# Patient Record
Sex: Male | Born: 1970 | Race: Black or African American | Hispanic: No | Marital: Married | Smoking: Never smoker
Health system: Southern US, Community
[De-identification: ages and names within clinical notes are randomized; demographics above are authoritative.]

## PROBLEM LIST (undated history)

## (undated) DIAGNOSIS — N289 Disorder of kidney and ureter, unspecified: Secondary | ICD-10-CM

## (undated) HISTORY — PX: LITHOTRIPSY: SUR834

## (undated) HISTORY — PX: VASECTOMY: SHX75

---

## 1999-05-22 ENCOUNTER — Emergency Department (HOSPITAL_COMMUNITY): Admission: EM | Admit: 1999-05-22 | Discharge: 1999-05-22 | Payer: Self-pay | Admitting: Emergency Medicine

## 2000-01-09 ENCOUNTER — Emergency Department (HOSPITAL_COMMUNITY): Admission: EM | Admit: 2000-01-09 | Discharge: 2000-01-10 | Payer: Self-pay | Admitting: Emergency Medicine

## 2002-06-03 ENCOUNTER — Encounter: Payer: Self-pay | Admitting: Emergency Medicine

## 2002-06-03 ENCOUNTER — Emergency Department (HOSPITAL_COMMUNITY): Admission: EM | Admit: 2002-06-03 | Discharge: 2002-06-03 | Payer: Self-pay | Admitting: Emergency Medicine

## 2003-11-17 ENCOUNTER — Emergency Department (HOSPITAL_COMMUNITY): Admission: EM | Admit: 2003-11-17 | Discharge: 2003-11-17 | Payer: Self-pay | Admitting: Emergency Medicine

## 2005-03-04 ENCOUNTER — Emergency Department (HOSPITAL_COMMUNITY): Admission: EM | Admit: 2005-03-04 | Discharge: 2005-03-04 | Payer: Self-pay | Admitting: Emergency Medicine

## 2006-08-14 ENCOUNTER — Emergency Department (HOSPITAL_COMMUNITY): Admission: EM | Admit: 2006-08-14 | Discharge: 2006-08-14 | Payer: Self-pay | Admitting: Emergency Medicine

## 2008-04-03 ENCOUNTER — Emergency Department (HOSPITAL_COMMUNITY): Admission: EM | Admit: 2008-04-03 | Discharge: 2008-04-03 | Payer: Self-pay | Admitting: Emergency Medicine

## 2008-04-12 ENCOUNTER — Ambulatory Visit (HOSPITAL_COMMUNITY): Admission: RE | Admit: 2008-04-12 | Discharge: 2008-04-12 | Payer: Self-pay | Admitting: Urology

## 2010-07-01 ENCOUNTER — Emergency Department (HOSPITAL_COMMUNITY): Admission: EM | Admit: 2010-07-01 | Discharge: 2010-07-01 | Payer: Self-pay | Admitting: Emergency Medicine

## 2010-11-22 LAB — BASIC METABOLIC PANEL
BUN: 9 mg/dL (ref 6–23)
CO2: 25 mEq/L (ref 19–32)
Calcium: 9.1 mg/dL (ref 8.4–10.5)
Chloride: 108 mEq/L (ref 96–112)
Creatinine, Ser: 1.11 mg/dL (ref 0.4–1.5)
GFR calc Af Amer: >60 mL/min (ref >60)
GFR calc non Af Amer: >60 mL/min (ref >60)
Glucose, Bld: 105 mg/dL — ABNORMAL HIGH (ref 70–99)
Potassium: 3.7 mEq/L (ref 3.5–5.1)
Sodium: 140 mEq/L (ref 135–145)

## 2010-11-22 LAB — DIFFERENTIAL
Basophils Absolute: 0.1 K/uL (ref 0.0–0.1)
Basophils Relative: 1 % (ref 0–1)
Eosinophils Absolute: 0.2 K/uL (ref 0.0–0.7)
Eosinophils Relative: 3 % (ref 0–5)
Lymphocytes Relative: 25 % (ref 12–46)
Lymphs Abs: 1.9 K/uL (ref 0.7–4.0)
Monocytes Absolute: 0.7 K/uL (ref 0.1–1.0)
Monocytes Relative: 9 % (ref 3–12)
Neutro Abs: 4.9 K/uL (ref 1.7–7.7)
Neutrophils Relative %: 63 % (ref 43–77)

## 2010-11-22 LAB — CBC
HCT: 39.7 % (ref 39.0–52.0)
Hemoglobin: 13.5 g/dL (ref 13.0–17.0)
MCH: 29.3 pg (ref 26.0–34.0)
MCHC: 34 g/dL (ref 30.0–36.0)
MCV: 86.2 fL (ref 78.0–100.0)
Platelets: 241 K/uL (ref 150–400)
RBC: 4.61 MIL/uL (ref 4.22–5.81)
RDW: 13.1 % (ref 11.5–15.5)
WBC: 7.8 K/uL (ref 4.0–10.5)

## 2011-06-08 LAB — CBC
HCT: 38.6 — ABNORMAL LOW
Hemoglobin: 12.9 — ABNORMAL LOW
MCHC: 33.5
MCV: 86.8
Platelets: 202
RBC: 4.44
RDW: 13.3
WBC: 7.7

## 2011-06-08 LAB — DIFFERENTIAL
Basophils Absolute: 0
Basophils Relative: 0
Eosinophils Absolute: 0.2
Eosinophils Relative: 3
Lymphocytes Relative: 20
Lymphs Abs: 1.5
Monocytes Absolute: 0.7
Monocytes Relative: 9
Neutro Abs: 5.2
Neutrophils Relative %: 68

## 2011-06-08 LAB — BASIC METABOLIC PANEL
BUN: 16
CO2: 25
Calcium: 8.9
Chloride: 107
Creatinine, Ser: 1.06
GFR calc Af Amer: >60
GFR calc non Af Amer: >60
Glucose, Bld: 103 — ABNORMAL HIGH
Potassium: 3.7
Sodium: 138

## 2011-06-08 LAB — URINE MICROSCOPIC-ADD ON

## 2011-06-08 LAB — URINALYSIS, ROUTINE W REFLEX MICROSCOPIC
Bilirubin Urine: NEGATIVE
Glucose, UA: NEGATIVE
Ketones, ur: NEGATIVE
Leukocytes, UA: NEGATIVE
Nitrite: NEGATIVE
Protein, ur: NEGATIVE
Specific Gravity, Urine: 1.021
Urobilinogen, UA: 0.2
pH: 6

## 2011-06-08 LAB — HEPATIC FUNCTION PANEL
ALT: 21
AST: 22
Albumin: 3.8
Alkaline Phosphatase: 64
Bilirubin, Direct: 0.1
Indirect Bilirubin: 1.1 — ABNORMAL HIGH
Total Bilirubin: 1.2
Total Protein: 6.6

## 2011-06-08 LAB — LIPASE, BLOOD: Lipase: 19

## 2012-01-28 ENCOUNTER — Emergency Department (HOSPITAL_COMMUNITY)
Admission: EM | Admit: 2012-01-28 | Discharge: 2012-01-28 | Disposition: A | Discharge status: Home / Self Care | Payer: Self-pay | Attending: Emergency Medicine | Admitting: Emergency Medicine

## 2012-01-28 ENCOUNTER — Emergency Department (HOSPITAL_COMMUNITY): Payer: PRIVATE HEALTH INSURANCE

## 2012-01-28 ENCOUNTER — Emergency Department (HOSPITAL_COMMUNITY)
Admission: EM | Admit: 2012-01-28 | Discharge: 2012-01-28 | Disposition: A | Discharge status: Home / Self Care | Payer: PRIVATE HEALTH INSURANCE | Attending: Emergency Medicine | Admitting: Emergency Medicine

## 2012-01-28 ENCOUNTER — Encounter (HOSPITAL_COMMUNITY): Payer: Self-pay | Admitting: Emergency Medicine

## 2012-01-28 ENCOUNTER — Encounter (HOSPITAL_COMMUNITY): Payer: Self-pay

## 2012-01-28 DIAGNOSIS — R209 Unspecified disturbances of skin sensation: Secondary | ICD-10-CM | POA: Insufficient documentation

## 2012-01-28 DIAGNOSIS — G510 Bell's palsy: Secondary | ICD-10-CM | POA: Insufficient documentation

## 2012-01-28 DIAGNOSIS — F411 Generalized anxiety disorder: Secondary | ICD-10-CM | POA: Insufficient documentation

## 2012-01-28 DIAGNOSIS — R5383 Other fatigue: Secondary | ICD-10-CM | POA: Insufficient documentation

## 2012-01-28 DIAGNOSIS — R51 Headache: Secondary | ICD-10-CM | POA: Insufficient documentation

## 2012-01-28 DIAGNOSIS — R5381 Other malaise: Secondary | ICD-10-CM | POA: Insufficient documentation

## 2012-01-28 HISTORY — DX: Disorder of kidney and ureter, unspecified: N28.9

## 2012-01-28 MED ORDER — PREDNISONE 20 MG PO TABS: 40.0000 mg | ORAL_TABLET | Freq: Every day | ORAL | Status: AC

## 2012-01-28 MED ORDER — DIPHENHYDRAMINE HCL 50 MG/ML IJ SOLN
25.0000 mg | Freq: Once | INTRAMUSCULAR | Status: AC
  Administered 2012-01-28: 25 mg via INTRAMUSCULAR
  Filled 2012-01-28: qty 1

## 2012-01-28 MED ORDER — PREDNISONE 20 MG PO TABS
60.0000 mg | ORAL_TABLET | Freq: Once | ORAL | Status: AC
  Administered 2012-01-28: 60 mg via ORAL
  Filled 2012-01-28: qty 3

## 2012-01-28 MED ORDER — METOCLOPRAMIDE HCL 5 MG/ML IJ SOLN
10.0000 mg | Freq: Once | INTRAMUSCULAR | Status: AC
  Administered 2012-01-28: 10 mg via INTRAMUSCULAR
  Filled 2012-01-28: qty 2

## 2012-01-28 MED ORDER — ACYCLOVIR 400 MG PO TABS: 400.0000 mg | ORAL_TABLET | Freq: Every day | ORAL | Status: AC

## 2012-01-28 NOTE — ED Notes (Signed)
Pt states he began having a headache a week ago.  He also a few days ago that his food didn't taste the same.  He went to the beach over the weekend and had problems blinking his right eye closed and has numbness around the right side of his mouth.

## 2012-01-28 NOTE — ED Notes (Signed)
Dx with bell's palsy today at [facility] ED, but having headaches, not satisfied with level of carereceived (per patient), states has headache right temporal area

## 2012-01-28 NOTE — Discharge Instructions (Signed)
Please read and follow all provided instructions.  Your diagnoses today include:  1. Headache   2. Bell's palsy     Tests performed today include:  CT of your head which was normal and did not show any serious cause of your headache  Vital signs. See below for your results today.   Medications:  In the Emergency Department you received:  Reglan - antinausea/headache medication  Benadryl - antihistamine to counteract potential side effects of reglan  Take any prescribed medications only as directed.  Additional information:  Follow any educational materials contained in this packet.  You are having a headache. No specific cause was found today for your headache. It may have been a migraine or other cause of headache. Stress, anxiety, fatigue, and depression are common triggers for headaches.  Your headache today does not appear to be life-threatening or require hospitalization, but often the exact cause of headaches is not determined in the emergency department. Therefore, follow-up with your doctor is very important to find out what may have caused your headache and whether or not you need any further diagnostic testing or treatment.   Sometimes headaches can appear benign (not harmful), but then more serious symptoms can develop which should prompt an immediate re-evaluation by your doctor or the emergency department.  BE VERY CAREFUL not to take multiple medicines containing Tylenol (also called acetaminophen). Doing so can lead to an overdose which can damage your liver and cause liver failure and possibly death.   Follow-up instructions: Please follow-up with your primary care provider in the next 3 days for further evaluation of your symptoms. If you do not have a primary care doctor -- see below for referral information.   Keep your neurology appointment as scheduled.   Return instructions:   Please return to the Emergency Department if you experience worsening  symptoms.  Return if the medications do not resolve your headache, if it recurs, or if you have multiple episodes of vomiting or cannot keep down fluids.  Return if you have a change from the usual headache.  RETURN IMMEDIATELY IF you:  Develop a sudden, severe headache  Develop confusion or become poorly responsive or faint  Develop a fever above 100.19F or problem breathing  Have a change in speech, vision, swallowing, or understanding  Develop new weakness, numbness, tingling, incoordination in your arms or legs  Have a seizure  Please return if you have any other emergent concerns.  Additional Information:  Your vital signs today were: BP 115/63  Pulse 76  Temp(Src) 98.3 F (36.8 C) (Oral)  Resp 20  Wt 184 lb 8 oz (83.689 kg)  SpO2 98% If your blood pressure (BP) was elevated above 135/85 this visit, please have this repeated by your doctor within one month. -------------- No Primary Care Doctor Call [facility]  [phone] Other agencies that provide inexpensive medical care    [facility]  [phone]    [facility]  [phone]    [facility]  [phone]    [facility]  [phone]    [facility]  [phone]    [facility]  [phone] -------------- RESOURCE GUIDE:  Dental Problems  Patients with Medicaid: [facility] [phone] [address]  [address] Phone:  [phone]                                                   Phone:  [phone]  If unable to pay or uninsured, contact:  [facility] or [facility]. to become qualified for the adult dental clinic.  Chronic Pain Problems Contact [facility]  [phone] Patients need to be referred by their primary care doctor.  Insufficient Money for Medicine Contact [employer]:  call "211" or [facility]  [phone].  Psychological Services [facility]  [phone] [facility]  [phone] [facility]   [phone] (emergency services [phone])  Substance Abuse Resources [facility]  [phone] [facility] [phone] [facility] [phone] Floydene Flock [phone] Residential & Outpatient Substance Abuse Program  [phone]  Abuse/Neglect [facility] Child Abuse Hotline 607-157-4657 [facility] Child Abuse Hotline [phone] (After Hours)  Emergency Shelter [facility] 825-800-3907  Maternity Homes [facility] [place] [facility] [phone] [place] [facility] [phone]  [facility] Resources  [facility]     [employer]                          [facility] [address] [place]                       [address]      [address]  Blondell Reveal Phone:  [phone]                                   Phone:  [phone]                 Phone:  [phone]  [facility] Phone:  [phone]  [facility] Child Abuse Hotline [phone] (After Hours)

## 2012-01-28 NOTE — ED Provider Notes (Signed)
History     CSN: [id number]  Arrival date & time 01/28/12  0721   First MD Initiated Contact with Patient 01/28/12 [phone]      Chief Complaint  Patient presents with  . Numbness  . Headache    (Consider location/radiation/quality/duration/timing/severity/associated sxs/prior treatment) HPI Comments: Pt reports began having a funny taste in mouth 5 days ago, then this weekend has developed drooping to right side of mouth, sometimes drooling, difficulty spitting or gargling, always to right side and also cannot wink with right eyelid.  No numbness, has a mild HA to right side of head.  He has had worse HA's in the past several months, but has not previous and no current weakness or numbness to arms or legs, no difficulty walking or gait.  Pt drove self here.  No blurred vision.  No h/o cancer, HTN, smoking.  Has no PCP.  Denies any cold sores or rash.  No CP, SOB, N/V/D.    Patient is a 41 y.o. male presenting with headaches. The history is provided by the patient.  Headache  Pertinent negatives include no shortness of breath.    Past Medical History  Diagnosis Date  . Renal disorder     Past Surgical History  Procedure Date  . Lithotripsy   . Vasectomy     No family history on file.  History  Substance Use Topics  . Smoking status: Never Smoker   . Smokeless tobacco: Not on file  . Alcohol Use: No      Review of Systems  HENT: Negative for trouble swallowing.   Respiratory: Negative for shortness of breath.   Cardiovascular: Negative for chest pain.  Musculoskeletal: Negative for gait problem.  Skin: Negative for rash.  Neurological: Positive for speech difficulty, weakness, numbness and headaches. Negative for dizziness, syncope and light-headedness.  All other systems reviewed and are negative.    Allergies  Vicodin  Home Medications   Current Outpatient Rx  Name Route Sig Dispense Refill  . ACYCLOVIR 400 MG PO TABS Oral Take 1 tablet (400 mg total) by  mouth 5 (five) times daily. 50 tablet 0  . PREDNISONE 20 MG PO TABS Oral Take 2 tablets (40 mg total) by mouth daily. 12 tablet 0    BP 117/71  Pulse 74  Resp 15  SpO2 99%  Physical Exam  Nursing note and vitals reviewed. Constitutional: He is oriented to person, place, and time. He appears well-developed and well-nourished.  Non-toxic appearance. He does not have a sickly appearance. He does not appear ill. No distress.  HENT:  Head: Normocephalic and atraumatic.  Right Ear: Tympanic membrane and external ear normal.  Left Ear: Tympanic membrane and external ear normal.  Mouth/Throat: Uvula is midline, oropharynx is clear and moist and mucous membranes are normal.  Pulmonary/Chest: Effort normal.  Neurological: He is alert and oriented to person, place, and time. He has normal strength. A cranial nerve deficit is present. No sensory deficit. Gait normal.       5/5 strength to arms and legs, no arm drift, normal finger to nose, normal gait.  Pt with partial bell's palsy to right side with involvement of forehead as well indicating Bell''s and not CVA.    Skin: Skin is warm, dry and intact. No rash noted.  Psychiatric: His speech is normal and behavior is normal. Thought content normal. His mood appears anxious.    ED Course  Procedures (including critical care time)  Labs Reviewed - No data to display  No results found.   1. Bell's palsy       MDM  BP normal.  Symptoms began 5 days ago.  Exam and history is highly suggestive of isolated Bell's palsy.  HA is not worst ever, no focal defiicts other than that of facial nerve to right side.  Pt is instructed that if not improved in 1-2 weeks, to follow up with [facility] for follow up for additional treatment, possibly scanning.  Pt knows to return for involvement of arms, legs, gait.  Rx for acyclovir and prednisone given.          Gavin Pound. Oletta Lamas, MD 01/28/12 [phone]

## 2012-01-28 NOTE — ED Provider Notes (Signed)
History     CSN: [id number]  Arrival date & time 01/28/12  1508   First MD Initiated Contact with Patient 01/28/12 1959      Chief Complaint  Patient presents with  . bell's palsy     dx today at [facility] ED  . Headache    (Consider location/radiation/quality/duration/timing/severity/associated sxs/prior treatment) HPI Comments: Patient presents with four-day history of right unilateral headache, with gradual onset, no radiation. Patient developed right-sided facial weakness 2 days ago. He was evaluated at [facility] emergency department this morning and diagnosed with Bell's palsy. Patient was given neurology followup and he has scheduled appointment. He was started on prednisone and acyclovir. Patient states the headache is not any better and he is concerned about the cause of his symptoms. He is requesting a CT scan. Patient denies blurry vision, nausea or vomiting, trouble walking, weakness/numbness/tingling in his arms or his legs, fever, neck stiffness. Headache is not the worst of his life. It is not a thunderclap headache. Patient is using over-the-counter medications for his headaches but these have not helped. Patient has history of headaches however he has never had one has lasted this long. Light makes it worse. Loud sounds do not.   Patient is a 41 y.o. male presenting with headaches. The history is provided by the patient.  Headache  This is a new problem. The current episode started more than 2 days ago. The problem occurs constantly. The problem has not changed since onset.The headache is associated with nothing. The pain is located in the right unilateral region. The quality of the pain is described as throbbing. The pain is moderate. The pain does not radiate. Pertinent negatives include no fever, no syncope, no shortness of breath, no nausea and no vomiting. He has tried NSAIDs for the symptoms. The treatment provided no relief.    Past Medical History  Diagnosis Date  . Renal  disorder     Past Surgical History  Procedure Date  . Lithotripsy   . Vasectomy     No family history on file.  History  Substance Use Topics  . Smoking status: Never Smoker   . Smokeless tobacco: Not on file  . Alcohol Use: No      Review of Systems  Constitutional: Negative for fever and fatigue.  HENT: Negative for trouble swallowing, neck pain, neck stiffness and tinnitus.   Eyes: Positive for photophobia. Negative for pain and visual disturbance.  Respiratory: Negative for shortness of breath.   Cardiovascular: Negative for chest pain and syncope.  Gastrointestinal: Negative for nausea and vomiting.  Musculoskeletal: Negative for back pain and gait problem.  Skin: Negative for wound.  Neurological: Positive for weakness and headaches. Negative for dizziness, speech difficulty, light-headedness and numbness.  Psychiatric/Behavioral: Negative for confusion and decreased concentration.    Allergies  Vicodin  Home Medications   Current Outpatient Rx  Name Route Sig Dispense Refill  . ACYCLOVIR 400 MG PO TABS Oral Take 1 tablet (400 mg total) by mouth 5 (five) times daily. 50 tablet 0  . IBUPROFEN 200 MG PO TABS Oral Take 400 mg by mouth every 8 (eight) hours as needed. For pain.    Marland Kitchen NAPROXEN SODIUM 220 MG PO TABS Oral Take 440 mg by mouth 2 (two) times daily as needed. For pain.    Marland Kitchen PREDNISONE 20 MG PO TABS Oral Take 2 tablets (40 mg total) by mouth daily. 12 tablet 0    BP 115/63  Pulse 76  Temp(Src) 98.3 F (36.8  C) (Oral)  Resp 20  Wt 184 lb 8 oz (83.689 kg)  SpO2 98%  Physical Exam  Nursing note and vitals reviewed. Constitutional: He is oriented to person, place, and time. He appears well-developed and well-nourished.  HENT:  Head: Normocephalic and atraumatic.  Right Ear: Hearing, tympanic membrane, external ear and ear canal normal.  Left Ear: Hearing, tympanic membrane, external ear and ear canal normal.  Nose: Nose normal.  Mouth/Throat: Uvula  is midline, oropharynx is clear and moist and mucous membranes are normal.       No Ramsay-Hunt  Eyes: Conjunctivae are normal. Right eye exhibits no discharge. Left eye exhibits no discharge.  Neck: Normal range of motion. Neck supple.  Cardiovascular: Normal rate, regular rhythm and normal heart sounds.   Pulmonary/Chest: Effort normal and breath sounds normal.  Abdominal: Soft. There is no tenderness.  Neurological: He is alert and oriented to person, place, and time. A cranial nerve deficit is present. No sensory deficit. Coordination and gait normal. GCS eye subscore is 4. GCS verbal subscore is 5. GCS motor subscore is 6.       Mild R facial droop, loss of nasolabial fold on R, does not spare forehead  Skin: Skin is warm and dry.  Psychiatric: He has a normal mood and affect.    ED Course  Procedures (including critical care time)  Labs Reviewed - No data to display Ct Head Wo Contrast  01/28/2012  *RADIOLOGY REPORT*  Clinical Data: Right-sided headache and Bell's palsy.  CT HEAD WITHOUT CONTRAST  Technique:  Contiguous axial images were obtained from the base of the skull through the vertex without contrast.  Comparison: None.  Findings: The brain demonstrates no evidence of hemorrhage, infarction, edema, mass effect, extra-axial fluid collection, hydrocephalus or mass lesion.  The skull is unremarkable.  IMPRESSION: Normal head CT.  Original Report Authenticated By: Reola Calkins, M.D.     1. Headache   2. Bell's palsy     8:17 PM Patient seen and examined. Work-up initiated. Medications ordered. Previous visit note reviewed.   Vital signs reviewed and are as follows: Filed Vitals:   01/28/12 1602  BP: 115/63  Pulse: 76  Temp: 98.3 F (36.8 C)  Resp: 20   9:50 PM CT neg, patient informed of results. Headache resolved with Reglan. Patient is reassured. Will d/c to home. Urged to keep neuro appt, continue meds, return with worsening symptoms or other concerns. Patient  verbalizes understanding and agrees.    MDM  Patient with exam c/w Bells palsy. He was anxious about HA and requested CT - neg. He is on appropriate treatment. HA resolved with treatment in ED. Appears well at discharge.        Renne Crigler, [place] 01/28/12 2153

## 2012-01-28 NOTE — ED Notes (Signed)
Pt ambulated into ED without difficulty, reporting h/a, trouble blinking right eye, numbness in right side of mouth since Friday. No facial droop noted, equal grip strengths. Pt ambulated to triage without difficulty. A x 4

## 2012-01-28 NOTE — Discharge Instructions (Signed)
Bell's Palsy  Bell's palsy is a condition in which the muscles on one side of the face cannot move (paralysis). This is because the nerves in the face are paralyzed. It is most often thought to be caused by a virus. The virus causes swelling of the nerve that controls movement on one side of the face. The nerve travels through a tight space surrounded by bone. When the nerve swells, it can be compressed by the bone. This results in damage to the protective covering around the nerve. This damage interferes with how the nerve communicates with the muscles of the face. As a result, it can cause weakness or paralysis of the facial muscles.   Injury (trauma), tumor, and surgery may cause Bell's palsy, but most of the time the cause is unknown. It is a relatively common condition. It starts suddenly (abrupt onset) with the paralysis usually ending within 2 days. Bell's palsy is not dangerous. But because the eye does not close properly, you may need care to keep the eye from getting dry. This can include splinting (to keep the eye shut) or moistening with artificial tears. Bell's palsy very seldom occurs on both sides of the face at the same time.  SYMPTOMS    Eyebrow sagging.   Drooping of the eyelid and corner of the mouth.   Inability to close one eye.   Loss of taste on the front of the tongue.   Sensitivity to loud noises.  TREATMENT   The treatment is usually non-surgical. If the patient is seen within the first 24 to 48 hours, a short course of steroids may be prescribed, in an attempt to shorten the length of the condition. Antiviral medicines may also be used with the steroids, but it is unclear if they are helpful.   You will need to protect your eye, if you cannot close it. The cornea (clear covering over your eye) will become dry and can be damaged. Artificial tears can be used to keep your eye moist. Glasses or an eye patch should be worn to protect your eye.  PROGNOSIS   Recovery is variable, ranging  from days to months. Although the problem usually goes away completely (about 80% of cases resolve), predicting the outcome is impossible. Most people improve within 3 weeks of when the symptoms began. Improvement may continue for 3 to 6 months. A small number of people have moderate to severe weakness that is permanent.   HOME CARE INSTRUCTIONS    If your caregiver prescribed medication to reduce swelling in the nerve, use as directed. Do not stop taking the medication unless directed by your caregiver.   Use moisturizing eye drops as needed to prevent drying of your eye, as directed by your caregiver.   Protect your eye, as directed by your caregiver.   Use facial massage and exercises, as directed by your caregiver.   Perform your normal activities, and get your normal rest.  SEEK IMMEDIATE MEDICAL CARE IF:    There is pain, redness or irritation in the eye.   You or your child has an oral temperature above 102 F (38.9 C), not controlled by medicine.  MAKE SURE YOU:    Understand these instructions.   Will watch your condition.   Will get help right away if you are not doing well or get worse.  Document Released: 08/27/2005 Document Revised: 08/16/2011 Document Reviewed: 09/05/2009  Exit[employer] Patient Information 2012 [employer].

## 2012-01-28 NOTE — ED Provider Notes (Signed)
Medical screening examination/treatment/procedure(s) were performed by non-physician practitioner and as supervising physician I was immediately available for consultation/collaboration.  Shawntavia Saunders R Yuette Putnam, MD 01/28/12 2341 

## 2014-06-16 ENCOUNTER — Emergency Department (HOSPITAL_COMMUNITY)
Admission: EM | Admit: 2014-06-16 | Discharge: 2014-06-16 | Disposition: A | Discharge status: Home / Self Care | Payer: Commercial Managed Care - PPO | Attending: Emergency Medicine | Admitting: Emergency Medicine

## 2014-06-16 ENCOUNTER — Emergency Department (HOSPITAL_COMMUNITY): Payer: Commercial Managed Care - PPO

## 2014-06-16 ENCOUNTER — Encounter (HOSPITAL_COMMUNITY): Payer: Self-pay | Admitting: Emergency Medicine

## 2014-06-16 DIAGNOSIS — Z79899 Other long term (current) drug therapy: Secondary | ICD-10-CM | POA: Diagnosis not present

## 2014-06-16 DIAGNOSIS — S20409A Unspecified superficial injuries of unspecified back wall of thorax, initial encounter: Secondary | ICD-10-CM | POA: Diagnosis not present

## 2014-06-16 DIAGNOSIS — S199XXA Unspecified injury of neck, initial encounter: Secondary | ICD-10-CM | POA: Diagnosis present

## 2014-06-16 DIAGNOSIS — S0990XA Unspecified injury of head, initial encounter: Secondary | ICD-10-CM | POA: Diagnosis not present

## 2014-06-16 DIAGNOSIS — S3992XA Unspecified injury of lower back, initial encounter: Secondary | ICD-10-CM | POA: Insufficient documentation

## 2014-06-16 DIAGNOSIS — Y92410 Unspecified street and highway as the place of occurrence of the external cause: Secondary | ICD-10-CM | POA: Diagnosis not present

## 2014-06-16 DIAGNOSIS — Z87448 Personal history of other diseases of urinary system: Secondary | ICD-10-CM | POA: Insufficient documentation

## 2014-06-16 DIAGNOSIS — Y9389 Activity, other specified: Secondary | ICD-10-CM | POA: Insufficient documentation

## 2014-06-16 LAB — CBC WITH DIFFERENTIAL/PLATELET
Basophils Absolute: 0 K/uL (ref 0.0–0.1)
Basophils Relative: 0 % (ref 0–1)
Eosinophils Absolute: 0.2 K/uL (ref 0.0–0.7)
Eosinophils Relative: 2 % (ref 0–5)
HCT: 38.5 % — ABNORMAL LOW (ref 39.0–52.0)
Hemoglobin: 13.3 g/dL (ref 13.0–17.0)
Lymphocytes Relative: 16 % (ref 12–46)
Lymphs Abs: 1.4 K/uL (ref 0.7–4.0)
MCH: 28.5 pg (ref 26.0–34.0)
MCHC: 34.5 g/dL (ref 30.0–36.0)
MCV: 82.6 fL (ref 78.0–100.0)
Monocytes Absolute: 0.6 K/uL (ref 0.1–1.0)
Monocytes Relative: 7 % (ref 3–12)
Neutro Abs: 7 K/uL (ref 1.7–7.7)
Neutrophils Relative %: 75 % (ref 43–77)
Platelets: 244 K/uL (ref 150–400)
RBC: 4.66 MIL/uL (ref 4.22–5.81)
RDW: 13.1 % (ref 11.5–15.5)
WBC: 9.2 K/uL (ref 4.0–10.5)

## 2014-06-16 LAB — BASIC METABOLIC PANEL WITH GFR
Anion gap: 12 (ref 5–15)
BUN: 12 mg/dL (ref 6–23)
CO2: 23 meq/L (ref 19–32)
Calcium: 9 mg/dL (ref 8.4–10.5)
Chloride: 102 meq/L (ref 96–112)
Creatinine, Ser: 0.99 mg/dL (ref 0.50–1.35)
GFR calc Af Amer: >90 mL/min
GFR calc non Af Amer: >90 mL/min
Glucose, Bld: 88 mg/dL (ref 70–99)
Potassium: 4.5 meq/L (ref 3.7–5.3)
Sodium: 137 meq/L (ref 137–147)

## 2014-06-16 MED ORDER — MORPHINE SULFATE 4 MG/ML IJ SOLN
4.0000 mg | Freq: Once | INTRAMUSCULAR | Status: AC
  Administered 2014-06-16: 4 mg via INTRAVENOUS
  Filled 2014-06-16: qty 1

## 2014-06-16 MED ORDER — CYCLOBENZAPRINE HCL 10 MG PO TABS: 10.0000 mg | ORAL_TABLET | Freq: Two times a day (BID) | ORAL | Status: DC | PRN

## 2014-06-16 MED ORDER — OXYCODONE-ACETAMINOPHEN 5-325 MG PO TABS: 1.0000 | ORAL_TABLET | Freq: Four times a day (QID) | ORAL | Status: DC | PRN

## 2014-06-16 MED ORDER — OXYCODONE-ACETAMINOPHEN 5-325 MG PO TABS
1.0000 | ORAL_TABLET | Freq: Once | ORAL | Status: AC
  Administered 2014-06-16: 1 via ORAL
  Filled 2014-06-16: qty 1

## 2014-06-16 MED ORDER — ONDANSETRON HCL 4 MG/2ML IJ SOLN
4.0000 mg | Freq: Once | INTRAMUSCULAR | Status: AC
  Administered 2014-06-16: 4 mg via INTRAVENOUS
  Filled 2014-06-16: qty 2

## 2014-06-16 MED ORDER — ONDANSETRON 4 MG PO TBDP
4.0000 mg | ORAL_TABLET | Freq: Once | ORAL | Status: AC
  Administered 2014-06-16: 4 mg via ORAL
  Filled 2014-06-16: qty 1

## 2014-06-16 MED ORDER — NAPROXEN 500 MG PO TABS: 500.0000 mg | ORAL_TABLET | Freq: Two times a day (BID) | ORAL | Status: DC

## 2014-06-16 NOTE — ED Notes (Signed)
Patient transported to X-ray without distress.  
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
## 2014-06-16 NOTE — ED Provider Notes (Signed)
  Medical screening examination/treatment/procedure(s) were performed by non-physician practitioner and as supervising physician I was immediately available for consultation/collaboration.   EKG Interpretation None         Gerhard Munchobert Amy Belloso, MD 06/16/14 [phone]
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
## 2014-06-16 NOTE — ED Notes (Signed)
PT ambulated with baseline gait; VSS; A&Ox3; no signs of distress; respirations even and unlabored; skin warm and dry; no questions upon discharge.  

## 2014-06-23 ENCOUNTER — Ambulatory Visit: Payer: Commercial Managed Care - PPO | Attending: Family Medicine

## 2014-06-23 DIAGNOSIS — M542 Cervicalgia: Secondary | ICD-10-CM | POA: Diagnosis not present

## 2014-06-23 DIAGNOSIS — Z5189 Encounter for other specified aftercare: Secondary | ICD-10-CM | POA: Diagnosis present

## 2014-06-23 DIAGNOSIS — M545 Low back pain: Secondary | ICD-10-CM | POA: Insufficient documentation

## 2014-06-23 DIAGNOSIS — R5381 Other malaise: Secondary | ICD-10-CM | POA: Diagnosis not present

## 2014-06-25 ENCOUNTER — Ambulatory Visit: Payer: Commercial Managed Care - PPO | Admitting: Physical Therapy

## 2014-06-25 DIAGNOSIS — Z5189 Encounter for other specified aftercare: Secondary | ICD-10-CM | POA: Diagnosis not present

## 2014-06-28 ENCOUNTER — Ambulatory Visit: Payer: Commercial Managed Care - PPO

## 2014-06-30 ENCOUNTER — Ambulatory Visit: Payer: Commercial Managed Care - PPO | Admitting: Physical Therapy

## 2014-06-30 DIAGNOSIS — Z5189 Encounter for other specified aftercare: Secondary | ICD-10-CM | POA: Diagnosis not present

## 2014-07-02 ENCOUNTER — Ambulatory Visit: Payer: Commercial Managed Care - PPO | Admitting: Physical Therapy

## 2014-07-02 DIAGNOSIS — Z5189 Encounter for other specified aftercare: Secondary | ICD-10-CM | POA: Diagnosis not present

## 2014-07-05 ENCOUNTER — Ambulatory Visit: Payer: Commercial Managed Care - PPO

## 2014-07-05 DIAGNOSIS — Z5189 Encounter for other specified aftercare: Secondary | ICD-10-CM | POA: Diagnosis not present

## 2014-07-07 ENCOUNTER — Ambulatory Visit: Payer: Commercial Managed Care - PPO

## 2014-07-07 DIAGNOSIS — Z5189 Encounter for other specified aftercare: Secondary | ICD-10-CM | POA: Diagnosis not present

## 2014-07-12 ENCOUNTER — Ambulatory Visit: Payer: Commercial Managed Care - PPO | Attending: Family Medicine | Admitting: Physical Therapy

## 2014-07-12 DIAGNOSIS — M542 Cervicalgia: Secondary | ICD-10-CM | POA: Diagnosis not present

## 2014-07-12 DIAGNOSIS — R5381 Other malaise: Secondary | ICD-10-CM | POA: Insufficient documentation

## 2014-07-12 DIAGNOSIS — M545 Low back pain: Secondary | ICD-10-CM | POA: Insufficient documentation

## 2014-07-12 DIAGNOSIS — Z5189 Encounter for other specified aftercare: Secondary | ICD-10-CM | POA: Insufficient documentation

## 2014-07-15 ENCOUNTER — Ambulatory Visit: Payer: Commercial Managed Care - PPO | Admitting: Physical Therapy

## 2014-07-15 DIAGNOSIS — Z5189 Encounter for other specified aftercare: Secondary | ICD-10-CM | POA: Diagnosis not present

## 2014-07-19 ENCOUNTER — Ambulatory Visit: Payer: Commercial Managed Care - PPO | Admitting: Physical Therapy

## 2014-07-19 DIAGNOSIS — Z5189 Encounter for other specified aftercare: Secondary | ICD-10-CM | POA: Diagnosis not present

## 2014-07-21 ENCOUNTER — Ambulatory Visit: Payer: Commercial Managed Care - PPO

## 2014-07-21 DIAGNOSIS — Z5189 Encounter for other specified aftercare: Secondary | ICD-10-CM | POA: Diagnosis not present

## 2014-07-22 ENCOUNTER — Encounter: Payer: Commercial Managed Care - PPO | Admitting: Physical Therapy

## 2014-07-26 ENCOUNTER — Ambulatory Visit: Payer: Commercial Managed Care - PPO | Admitting: Physical Therapy

## 2014-07-26 DIAGNOSIS — Z5189 Encounter for other specified aftercare: Secondary | ICD-10-CM | POA: Diagnosis not present

## 2014-07-29 ENCOUNTER — Ambulatory Visit: Payer: Commercial Managed Care - PPO

## 2014-08-09 ENCOUNTER — Ambulatory Visit: Payer: Commercial Managed Care - PPO | Admitting: Physical Therapy

## 2015-11-09 ENCOUNTER — Encounter (HOSPITAL_COMMUNITY): Payer: Self-pay | Admitting: *Deleted

## 2015-11-09 DIAGNOSIS — Z791 Long term (current) use of non-steroidal anti-inflammatories (NSAID): Secondary | ICD-10-CM | POA: Insufficient documentation

## 2015-11-09 DIAGNOSIS — N200 Calculus of kidney: Secondary | ICD-10-CM | POA: Insufficient documentation

## 2015-11-09 DIAGNOSIS — Z79899 Other long term (current) drug therapy: Secondary | ICD-10-CM | POA: Insufficient documentation

## 2015-11-09 NOTE — ED Notes (Signed)
Pt states that he has been experiencing left flank pain. States he has had a kidney stone before and it feels similar.

## 2015-11-10 ENCOUNTER — Emergency Department (HOSPITAL_COMMUNITY): Payer: Commercial Managed Care - PPO

## 2015-11-10 ENCOUNTER — Emergency Department (HOSPITAL_COMMUNITY)
Admission: EM | Admit: 2015-11-10 | Discharge: 2015-11-10 | Disposition: A | Discharge status: Home / Self Care | Payer: Commercial Managed Care - PPO | Attending: Emergency Medicine | Admitting: Emergency Medicine

## 2015-11-10 ENCOUNTER — Encounter (HOSPITAL_COMMUNITY): Payer: Self-pay | Admitting: Radiology

## 2015-11-10 DIAGNOSIS — N200 Calculus of kidney: Secondary | ICD-10-CM

## 2015-11-10 LAB — URINALYSIS, ROUTINE W REFLEX MICROSCOPIC
Bilirubin Urine: NEGATIVE
Glucose, UA: NEGATIVE mg/dL
Hgb urine dipstick: NEGATIVE
Ketones, ur: NEGATIVE mg/dL
Leukocytes, UA: NEGATIVE
Nitrite: NEGATIVE
Protein, ur: NEGATIVE mg/dL
Specific Gravity, Urine: 1.006 (ref 1.005–1.030)
pH: 7 (ref 5.0–8.0)

## 2015-11-10 MED ORDER — TAMSULOSIN HCL 0.4 MG PO CAPS: 0.4000 mg | ORAL_CAPSULE | Freq: Every day | ORAL | Status: DC

## 2015-11-10 MED ORDER — ONDANSETRON 8 MG PO TBDP: ORAL_TABLET | ORAL | Status: DC

## 2015-11-10 MED ORDER — MORPHINE SULFATE (PF) 4 MG/ML IV SOLN
4.0000 mg | Freq: Once | INTRAVENOUS | Status: AC
  Administered 2015-11-10: 4 mg via INTRAVENOUS
  Filled 2015-11-10: qty 1

## 2015-11-10 MED ORDER — KETOROLAC TROMETHAMINE 30 MG/ML IJ SOLN
30.0000 mg | Freq: Once | INTRAMUSCULAR | Status: AC
  Administered 2015-11-10: 30 mg via INTRAVENOUS
  Filled 2015-11-10: qty 1

## 2015-11-10 MED ORDER — OXYCODONE-ACETAMINOPHEN 5-325 MG PO TABS: 1.0000 | ORAL_TABLET | Freq: Four times a day (QID) | ORAL | Status: DC | PRN

## 2015-11-10 MED ORDER — ONDANSETRON HCL 4 MG/2ML IJ SOLN
4.0000 mg | Freq: Once | INTRAMUSCULAR | Status: AC
  Administered 2015-11-10: 4 mg via INTRAVENOUS
  Filled 2015-11-10: qty 2

## 2015-11-10 NOTE — ED Provider Notes (Signed)
CSN: [id number]     Arrival date & time 11/09/15  2334 History  By signing my name below, I, Melvin Nichols, attest that this documentation has been prepared under the direction and in the presence of Geoffery Lyons, MD. Electronically Signed: Bethel Nichols, ED Scribe. 11/10/2015. 2:44 AM   Chief Complaint  Patient presents with  . Flank Pain    The history is provided by the patient. No language interpreter was used.   Melvin Nichols is a 45 y.o. male with history of kidney stones who presents to the Emergency Department complaining of constant, 10/10 in severity, left flank pain with onset last night near 11 PM. This pain is similar to pain that he has had in the past with kidney stones.  Associated symptoms include nausea.  Past Medical History  Diagnosis Date  . Renal disorder    Past Surgical History  Procedure Laterality Date  . Lithotripsy    . Vasectomy     No family history on file. Social History  Substance Use Topics  . Smoking status: Never Smoker   . Smokeless tobacco: None  . Alcohol Use: No    Review of Systems  10 Systems reviewed and all are negative for acute change except as noted in the HPI.  Allergies  Vicodin  Home Medications   Prior to Admission medications   Medication Sig Start Date End Date Taking? Authorizing Provider  Aspirin-Salicylamide-Caffeine (BC HEADACHE PO) Take 1 Package by mouth daily.    Historical Provider, MD  cyclobenzaprine (FLEXERIL) 10 MG tablet Take 1 tablet (10 mg total) by mouth 2 (two) times daily as needed for muscle spasms. 06/16/14   Tiffany Neva Seat, PA-C  diphenhydrAMINE (BENADRYL) 25 MG tablet Take 25 mg by mouth every 6 (six) hours as needed for allergies.    Historical Provider, MD  ibuprofen (ADVIL,MOTRIN) 200 MG tablet Take 400 mg by mouth every 8 (eight) hours as needed. For pain.    Historical Provider, MD  naproxen (NAPROSYN) 500 MG tablet Take 1 tablet (500 mg total) by mouth 2 (two) times daily. 06/16/14    Marlon Pel, PA-C  oxyCODONE-acetaminophen (PERCOCET/ROXICET) 5-325 MG per tablet Take 1-2 tablets by mouth every 6 (six) hours as needed. 06/16/14   Tiffany Neva Seat, PA-C   BP 112/56 mmHg  Pulse 81  Temp(Src) 98.3 F (36.8 C) (Oral)  Resp 18  SpO2 100% Physical Exam  Constitutional: He is oriented to person, place, and time. He appears well-developed and well-nourished.  HENT:  Head: Normocephalic and atraumatic.  Eyes: EOM are normal.  Neck: Normal range of motion.  Cardiovascular: Normal rate, regular rhythm, normal heart sounds and intact distal pulses.   Pulmonary/Chest: Effort normal and breath sounds normal. No respiratory distress.  Abdominal: Soft. He exhibits no distension. There is tenderness. There is no rebound and no guarding.  TTP in the left abdomen and left flank   Musculoskeletal: Normal range of motion.  Neurological: He is alert and oriented to person, place, and time.  Skin: Skin is warm and dry.  Psychiatric: He has a normal mood and affect. Judgment normal.  Nursing note and vitals reviewed.   ED Course  Procedures (including critical care time) DIAGNOSTIC STUDIES: Oxygen Saturation is 100% on RA,  normal by my interpretation.    COORDINATION OF CARE: 1:06 AM Discussed treatment plan which includes UA, CT renal stone, morphine, Toradol, and Zofran with pt at bedside and pt agreed to plan.  2:44 AM I re-evaluated the patient and provided  an update on the results of his CT scan.    Labs Review Labs Reviewed  URINALYSIS, ROUTINE W REFLEX MICROSCOPIC (NOT AT [facility])    [facility] No results found. I have personally reviewed and evaluated these images and lab results as part of my medical decision-making.    MDM   Final diagnoses:  None    Patient presents with severe left flank pain. He has a history of kidney stones. Workup this evening reveals clear urinalysis, but has a 7 mm proximal UPJ calculus causing obstruction. He was given  medications in the ER and his pain is much more tolerable. He will be discharged with Percocet and follow-up with [facility].  I personally performed the services described in this documentation, which was scribed in my presence. The recorded information has been reviewed and is accurate.       Geoffery Lyons, MD 11/10/15 [phone]

## 2015-11-10 NOTE — Discharge Instructions (Signed)
Percocet as prescribed as needed for pain.  Flomax as prescribed.  Follow-up with [facility] in the next 1-2 days. The contact information has been provided in this discharge summary for you to call and arrange this appointment.  Return to the emergency department if you develop worsening pain, high fever, or other new and concerning symptoms.   Kidney Stones Kidney stones (urolithiasis) are deposits that form inside your kidneys. The intense pain is caused by the stone moving through the urinary tract. When the stone moves, the ureter goes into spasm around the stone. The stone is usually passed in the urine.  CAUSES   A disorder that makes certain neck glands produce too much parathyroid hormone (primary hyperparathyroidism).  A buildup of uric acid crystals, similar to gout in your joints.  Narrowing (stricture) of the ureter.  A kidney obstruction present at birth (congenital obstruction).  Previous surgery on the kidney or ureters.  Numerous kidney infections. SYMPTOMS   Feeling sick to your stomach (nauseous).  Throwing up (vomiting).  Blood in the urine (hematuria).  Pain that usually spreads (radiates) to the groin.  Frequency or urgency of urination. DIAGNOSIS   Taking a history and physical exam.  Blood or urine tests.  CT scan.  Occasionally, an examination of the inside of the urinary bladder (cystoscopy) is performed. TREATMENT   Observation.  Increasing your fluid intake.  Extracorporeal shock wave lithotripsy--This is a noninvasive procedure that uses shock waves to break up kidney stones.  Surgery may be needed if you have severe pain or persistent obstruction. There are various surgical procedures. Most of the procedures are performed with the use of small instruments. Only small incisions are needed to accommodate these instruments, so recovery time is minimized. The size, location, and chemical composition are all important variables that  will determine the proper choice of action for you. Talk to your health care provider to better understand your situation so that you will minimize the risk of injury to yourself and your kidney.  HOME CARE INSTRUCTIONS   Drink enough water and fluids to keep your urine clear or pale yellow. This will help you to pass the stone or stone fragments.  Strain all urine through the provided strainer. Keep all particulate matter and stones for your health care provider to see. The stone causing the pain may be as small as a grain of salt. It is very important to use the strainer each and every time you pass your urine. The collection of your stone will allow your health care provider to analyze it and verify that a stone has actually passed. The stone analysis will often identify what you can do to reduce the incidence of recurrences.  Only take over-the-counter or prescription medicines for pain, discomfort, or fever as directed by your health care provider.  Keep all follow-up visits as told by your health care provider. This is important.  Get follow-up X-rays if required. The absence of pain does not always mean that the stone has passed. It may have only stopped moving. If the urine remains completely obstructed, it can cause loss of kidney function or even complete destruction of the kidney. It is your responsibility to make sure X-rays and follow-ups are completed. Ultrasounds of the kidney can show blockages and the status of the kidney. Ultrasounds are not associated with any radiation and can be performed easily in a matter of minutes.  Make changes to your daily diet as told by your health care provider. You  may be told to:  Limit the amount of salt that you eat.  Eat 5 or more servings of fruits and vegetables each day.  Limit the amount of meat, poultry, fish, and eggs that you eat.  Collect a 24-hour urine sample as told by your health care provider.You may need to collect another urine  sample every 6-12 months. SEEK MEDICAL CARE IF:  You experience pain that is progressive and unresponsive to any pain medicine you have been prescribed. SEEK IMMEDIATE MEDICAL CARE IF:   Pain cannot be controlled with the prescribed medicine.  You have a fever or shaking chills.  The severity or intensity of pain increases over 18 hours and is not relieved by pain medicine.  You develop a new onset of abdominal pain.  You feel faint or pass out.  You are unable to urinate.   This information is not intended to replace advice given to you by your health care provider. Make sure you discuss any questions you have with your health care provider.   Document Released: 08/27/2005 Document Revised: 05/18/2015 Document Reviewed: 01/28/2013 Elsevier Interactive Patient Education [employer]2016 Elsevier Inc.

## 2015-11-11 ENCOUNTER — Other Ambulatory Visit: Payer: Self-pay | Admitting: Urology

## 2015-11-15 ENCOUNTER — Encounter (HOSPITAL_COMMUNITY): Payer: Self-pay | Admitting: General Practice

## 2015-11-17 ENCOUNTER — Encounter (HOSPITAL_COMMUNITY)
Admission: RE | Disposition: A | Discharge status: Home / Self Care | Payer: Self-pay | Source: Ambulatory Visit | Attending: Urology

## 2015-11-17 ENCOUNTER — Encounter (HOSPITAL_COMMUNITY): Payer: Self-pay | Admitting: *Deleted

## 2015-11-17 ENCOUNTER — Ambulatory Visit (HOSPITAL_COMMUNITY)
Admission: RE | Admit: 2015-11-17 | Discharge: 2015-11-17 | Disposition: A | Discharge status: Home / Self Care | Payer: BLUE CROSS/BLUE SHIELD | Source: Ambulatory Visit | Attending: Urology | Admitting: Urology

## 2015-11-17 ENCOUNTER — Ambulatory Visit (HOSPITAL_COMMUNITY): Payer: BLUE CROSS/BLUE SHIELD

## 2015-11-17 DIAGNOSIS — N529 Male erectile dysfunction, unspecified: Secondary | ICD-10-CM | POA: Insufficient documentation

## 2015-11-17 DIAGNOSIS — Z9981 Dependence on supplemental oxygen: Secondary | ICD-10-CM | POA: Diagnosis not present

## 2015-11-17 DIAGNOSIS — Z841 Family history of disorders of kidney and ureter: Secondary | ICD-10-CM | POA: Insufficient documentation

## 2015-11-17 DIAGNOSIS — Z79899 Other long term (current) drug therapy: Secondary | ICD-10-CM | POA: Diagnosis not present

## 2015-11-17 DIAGNOSIS — Z87442 Personal history of urinary calculi: Secondary | ICD-10-CM | POA: Diagnosis not present

## 2015-11-17 DIAGNOSIS — N201 Calculus of ureter: Secondary | ICD-10-CM

## 2015-11-17 DIAGNOSIS — N132 Hydronephrosis with renal and ureteral calculous obstruction: Secondary | ICD-10-CM | POA: Diagnosis not present

## 2015-11-17 SURGERY — LITHOTRIPSY, ESWL
Anesthesia: LOCAL | Laterality: Left

## 2015-11-17 MED ORDER — DIAZEPAM 5 MG PO TABS
10.0000 mg | ORAL_TABLET | ORAL | Status: AC
  Administered 2015-11-17: 10 mg via ORAL
  Filled 2015-11-17: qty 2

## 2015-11-17 MED ORDER — SODIUM CHLORIDE 0.9 % IV SOLN
INTRAVENOUS | Status: DC
  Administered 2015-11-17: 07:00:00 via INTRAVENOUS

## 2015-11-17 MED ORDER — CIPROFLOXACIN HCL 500 MG PO TABS
500.0000 mg | ORAL_TABLET | ORAL | Status: AC
  Administered 2015-11-17: 500 mg via ORAL
  Filled 2015-11-17: qty 1

## 2015-11-17 MED ORDER — DIPHENHYDRAMINE HCL 25 MG PO CAPS
25.0000 mg | ORAL_CAPSULE | ORAL | Status: AC
  Administered 2015-11-17: 25 mg via ORAL
  Filled 2015-11-17: qty 1

## 2015-11-17 NOTE — Interval H&P Note (Signed)
History and Physical Interval Note:  11/17/2015 7:54 AM  Alvis LemmingsKeith R Counsell  has presented today for surgery, with the diagnosis of LEFT  PROXIMAL TWO MID URETERAL STONE  The various methods of treatment have been discussed with the patient and family. After consideration of risks, benefits and other options for treatment, the patient has consented to  Procedure(s): LEFT EXTRACORPOREAL SHOCK WAVE LITHOTRIPSY (ESWL) (Left) as a surgical intervention .  The patient's history has been reviewed, patient examined, no change in status, stable for surgery.  I have reviewed the patient's chart and labs.  Questions were answered to the patient's satisfaction.     Melvin Nichols I Kayzlee Wirtanen

## 2015-11-17 NOTE — H&P (Signed)
History of Present Illness Melvin Nichols is a 45 year old male patient of Dr. Annabell Howells seen as an emergent work in today for renal colic.     Erectile dysfunction: This has been managed with Cialis in the past.    Calculus disease: He required right sided lithotripsy for an upper ureteral stone by Dr. Annabell Howells in 9/09 with good fragmentation.  Stone analysis: Calcium oxylate monohydrate 90% and calcium oxalate dihydrate 10%    Interval history: CT scan 11/10/15 revealed a 6 mm left upper ureteral stone with mild hydronephrosis and Hounsfield units of 1000. Nonobstructing left renal calculi were identified but no right renal stones were present.  He reports that he began experiencing pain in the left flank region 48 hours ago. He said the pain became progressively worse and was not associated with radiation into the groin. It was associated with some nausea and vomiting. He was seen in the emergency room where his CT scan was obtained and placed on tamsulosin and given a prescription for pain medication and antiemetics. He said he's had some dull aching in the flank since that time but his greatest complaint currently is that of mild nausea. Zofran that he was prescribed does not seem to be completely eliminating his nausea.  His pain was moderate in severity. It was not modified by positional change. He has no associated hematuria or change in his voiding pattern.   Past Medical History Problems  1. History of Calculus of right ureter (N20.1) 2. History of kidney stones (K74.259)  Surgical History Problems  1. History of Renal Lithotripsy  Current Meds 1. Cialis 10 MG Oral Tablet; TAKE 1 TABLET DAILY 1 HOUR BEFORE NEEDED;  Therapy: 24Aug2009 to (Evaluate:27Aug2009); Last Rx:24Aug2009 Ordered 2. Cialis 20 MG Oral Tablet; TAKE 1 TABLET As Directed PRN;  Therapy: 24Aug2009 to (Last Rx:24Feb2010) Ordered 3. Oxycodone-Acetaminophen TABS;  Therapy: (Recorded:03Mar2017) to Recorded 4.  Phenergan TABS;  Therapy: (Recorded:03Mar2017) to Recorded 5. Tamsulosin HCl - 0.4 MG Oral Capsule;  Therapy: (Recorded:03Mar2017) to Recorded  Allergies Medication  1. Vicodin TABS  Family History Problems  1. Family history of Family Health Status Number Of Children   1 boy and 4 girls 2. Family history of Nephrolithiasis : Brother 3. Family history of Nephrolithiasis : Sister  Social History Problems    Denied: History of Alcohol Use (History)   Caffeine Use   Marital History - Currently Married   Never smoker   Occupation:   Marine scientist   Denied: History of Tobacco Use  Review of Systems Genitourinary, constitutional, skin, eye, otolaryngeal, hematologic/lymphatic, cardiovascular, pulmonary, endocrine, musculoskeletal, gastrointestinal, neurological and psychiatric system(s) were reviewed and pertinent findings if present are noted and are otherwise negative.  Genitourinary: urinary frequency, nocturia, weak urinary stream, urinary stream starts and stops, erectile dysfunction and initiating urination requires straining.  Gastrointestinal: nausea, vomiting and flank pain.  Eyes: blurred vision.    Vitals Vital Signs [Data Includes: Last 1 Day]  Recorded: 03Mar2017 11:29AM  Height: 5 ft 7 in Weight: 180 lb  BMI Calculated: 28.19 BSA Calculated: 1.93 Blood Pressure: 99 / 58 Heart Rate: 68  Physical Exam Constitutional: Well nourished and well developed . No acute distress.   ENT:. The ears and nose are normal in appearance.   Neck: The appearance of the neck is normal and no neck mass is present.   Pulmonary: No respiratory distress and normal respiratory rhythm and effort.   Cardiovascular: Heart rate and rhythm are normal . No peripheral edema.   Abdomen: The abdomen  is soft and nontender. No masses are palpated. No CVA tenderness. No hernias are palpable. No hepatosplenomegaly noted.   Lymphatics: The femoral and inguinal nodes are not enlarged or  tender.   Skin: Normal skin turgor, no visible rash and no visible skin lesions.   Neuro/Psych:. Mood and affect are appropriate.    Results/Data .   Old records or history reviewed: Prior records as above.  The following images/tracing/specimen were independently visualized:  CT scan as above.  The following radiology reports were reviewed: CT scan.  PVR: Ultrasound PVR 6 ml. He empties his bladder adequately.    Assessment Assessed  1. Ureteral calculus, left (N20.1) 2. Renal colic (N23) 3. Hydronephrosis, left (N13.30)  We discussed the management of urinary stones. These options include observation, ureteroscopy, shockwave lithotripsy, and PCNL. We discussed which options are relevant to these particular stones. We discussed the natural history of stones as well as the complications of untreated stones and the impact on quality of life without treatment as well as with each of the above listed treatments. We also discussed the efficacy of each treatment in its ability to clear the stone burden. With any of these management options I discussed the signs and symptoms of infection and the need for emergent treatment should these be experienced. For each option we discussed the ability of each procedure to clear the patient of their stone burden.    For observation I described the risks which include but are not limited to silent renal damage, life-threatening infection, need for emergent surgery, failure to pass stone, and pain.    For ureteroscopy I described the risks which include heart attack, stroke, pulmonary embolus, death, bleeding, infection, damage to contiguous structures, positioning injury, ureteral stricture, ureteral avulsion, ureteral injury, need for ureteral stent, inability to perform ureteroscopy, need for an interval procedure, inability to clear stone burden, stent discomfort and pain.    For shockwave lithotripsy I described the risks which include arrhythmia,  kidney contusion, kidney hemorrhage, need for transfusion, long-term risk of diabetes or hypertension, back discomfort, flank ecchymosis, flank abrasion, inability to break up stone, inability to pass stone fragments, Steinstrasse, infection associated with obstructing stones, need for different surgical procedure and possible need for repeat shockwave lithotripsy.    He has undergone lithotripsy in the past and would like to proceed with that again. His stone did fragment previously. In the meantime I will maintain him on medical expulsive therapy and have recommended he increase the dosage of his tamsulosin 0.8 mg for maximum benefit. In addition the Zofran did not seem to be as effective for him as Phenergan has been in the past and he said right now nausea is his greatest complaint so I will send him a prescription for Phenergan and also for some stronger pain medication. He will be in the scheduled for lithotripsy.   Plan Health Maintenance  1. UA With REFLEX; [Do Not Release]; Status:In Progress - Specimen/Data Collected;    Done: 03Mar2017 Hydronephrosis, left, Renal colic, Ureteral calculus, left  2. Start: HYDROmorphone HCl - 4 MG Oral Tablet; 1 - 2 po q 4 hrs prn pain 3. Start: Promethazine HCl - 25 MG Oral Tablet; TAKE 1 TABLET EVERY 4 TO 6 HOURS AS  NEEDED FOR NAUSEA 4. Start: Tamsulosin HCl - 0.4 MG Oral Capsule; TAKE (1) CAPSULE BY MOUTH AT  BEDTIME 5. Follow-up Schedule Surgery Office  Follow-up  Status: Complete  Done: 03Mar2017  1. Prescriptions for Phenergan, tamsulosin and Dilaudid.  2. He  will be scheduled for lithotripsy of his left proximal ureteral stone.   Signatures Electronically signed by : Ihor GullyMark Ottelin, M.D.; Nov 11 2015 12:06PM EST Environmental education officer(Author)

## 2015-11-17 NOTE — Discharge Instructions (Signed)
Dietary Guidelines to Help Prevent Kidney Stones Your risk of kidney stones can be decreased by adjusting the foods you eat. The most important thing you can do is drink enough fluid. You should drink enough fluid to keep your urine clear or pale yellow. The following guidelines provide specific information for the type of kidney stone you have had. GUIDELINES ACCORDING TO TYPE OF KIDNEY STONE Calcium Oxalate Kidney Stones  Reduce the amount of salt you eat. Foods that have a lot of salt cause your body to release excess calcium into your urine. The excess calcium can combine with a substance called oxalate to form kidney stones.  Reduce the amount of animal protein you eat if the amount you eat is excessive. Animal protein causes your body to release excess calcium into your urine. Ask your dietitian how much protein from animal sources you should be eating.  Avoid foods that are high in oxalates. If you take vitamins, they should have less than 500 mg of vitamin C. Your body turns vitamin C into oxalates. You do not need to avoid fruits and vegetables high in vitamin C. Calcium Phosphate Kidney Stones  Reduce the amount of salt you eat to help prevent the release of excess calcium into your urine.  Reduce the amount of animal protein you eat if the amount you eat is excessive. Animal protein causes your body to release excess calcium into your urine. Ask your dietitian how much protein from animal sources you should be eating.  Get enough calcium from food or take a calcium supplement (ask your dietitian for recommendations). Food sources of calcium that do not increase your risk of kidney stones include:  Broccoli.  Dairy products, such as cheese and yogurt.  Pudding. Uric Acid Kidney Stones  Do not have more than 6 oz of animal protein per day. FOOD SOURCES Animal Protein Sources  Meat (all types).  Poultry.  Eggs.  Fish, seafood. Foods High in Salt  Salt seasonings.  Soy  sauce.  Teriyaki sauce.  Cured and processed meats.  Salted crackers and snack foods.  Fast food.  Canned soups and most canned foods. Foods High in Oxalates  Grains:  Amaranth.  Barley.  Grits.  Wheat germ.  Bran.  Buckwheat flour.  All bran cereals.  Pretzels.  Whole wheat bread.  Vegetables:  Beans (wax).  Beets and beet greens.  Collard greens.  Eggplant.  Escarole.  Leeks.  Okra.  Parsley.  Rutabagas.  Spinach.  Swiss chard.  Tomato paste.  Fried potatoes.  Sweet potatoes.  Fruits:  Red currants.  Figs.  Kiwi.  Rhubarb.  Meat and Other Protein Sources:  Beans (dried).  Soy burgers and other soybean products.  Miso.  Nuts (peanuts, almonds, pecans, cashews, hazelnuts).  Nut butters.  Sesame seeds and tahini (paste made of sesame seeds).  Poppy seeds.  Beverages:  Chocolate drink mixes.  Soy milk.  Instant iced tea.  Juices made from high-oxalate fruits or vegetables.  Other:  Carob.  Chocolate.  Fruitcake.  Marmalades.   This information is not intended to replace advice given to you by your health care provider. Make sure you discuss any questions you have with your health care provider.   Document Released: 12/22/2010 Document Revised: 09/01/2013 Document Reviewed: 07/24/2013 Elsevier Interactive Patient Education 2016 Elsevier Inc.  
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
## 2016-11-27 ENCOUNTER — Telehealth: Payer: Self-pay | Admitting: Family Medicine

## 2016-11-27 NOTE — Telephone Encounter (Signed)
Called patient to r/s appt (12/07/16) °

## 2016-11-29 ENCOUNTER — Ambulatory Visit: Payer: BLUE CROSS/BLUE SHIELD | Admitting: Family Medicine

## 2016-12-07 ENCOUNTER — Ambulatory Visit: Payer: BLUE CROSS/BLUE SHIELD | Admitting: Family Medicine

## 2016-12-28 ENCOUNTER — Encounter: Payer: Self-pay | Admitting: Family Medicine

## 2016-12-28 ENCOUNTER — Ambulatory Visit (INDEPENDENT_AMBULATORY_CARE_PROVIDER_SITE_OTHER): Payer: BLUE CROSS/BLUE SHIELD | Admitting: Family Medicine

## 2016-12-28 VITALS — BP 110/68 | HR 68 | Temp 98.1°F | Resp 16 | Ht 67.0 in | Wt 169.0 lb

## 2016-12-28 DIAGNOSIS — R399 Unspecified symptoms and signs involving the genitourinary system: Secondary | ICD-10-CM | POA: Diagnosis not present

## 2016-12-28 DIAGNOSIS — Z87442 Personal history of urinary calculi: Secondary | ICD-10-CM | POA: Diagnosis not present

## 2016-12-28 DIAGNOSIS — Z7689 Persons encountering health services in other specified circumstances: Secondary | ICD-10-CM

## 2016-12-28 LAB — CBC
HCT: 41.3 % (ref 38.5–50.0)
Hemoglobin: 13.6 g/dL (ref 13.2–17.1)
MCH: 28.6 pg (ref 27.0–33.0)
MCHC: 32.9 g/dL (ref 32.0–36.0)
MCV: 86.9 fL (ref 80.0–100.0)
MPV: 9.5 fL (ref 7.5–12.5)
Platelets: 234 10*3/uL (ref 140–400)
RBC: 4.75 MIL/uL (ref 4.20–5.80)
RDW: 13.3 % (ref 11.0–15.0)
WBC: 9.6 10*3/uL (ref 3.8–10.8)

## 2016-12-28 NOTE — Progress Notes (Addendum)
Chief Complaint  Patient presents with  . Establish Care   Healthy 46 year old man No medical care except kidney stones for many years Due for a check up Does not get flu shots- discussed Tetanus up to date No medications Eats well Exercises Has questions  1.prostate cancer screening.  Has noted increase in nocturia 2. Cardiac risk factors - discussed 3. Gums bleed - see dentist 4. Is he ADD?  Is forgetful at times.  Discussed and it seems he is just stressed  Patient Active Problem List   Diagnosis Date Noted  . Personal history of kidney stones 12/28/2016    Outpatient Encounter Prescriptions as of 12/28/2016  Medication Sig   No facility-administered encounter medications on file as of 12/28/2016.     Past Medical History:  Diagnosis Date  . Renal disorder    stones    Past Surgical History:  Procedure Laterality Date  . LITHOTRIPSY    . VASECTOMY      Social History   Social History  . Marital status: Married    Spouse name: Eulogio Ditch  . Number of children: 5  . Years of education: 14   Occupational History  . 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 Smoking status: Never Smoker  . Smokeless tobacco: Never Used  . Alcohol use No  . Drug use: No  . Sexual activity: Yes    Birth control/ protection: Surgical   Other Topics Concern  . Not on file   Social History Narrative   Lives at home with 3 children   Married   Work out of home   tries to go to the GYM/ Keeps fit    Family History  Problem Relation Age of Onset  . Cancer Mother 75    breast, metastatic  . Diabetes Maternal Grandmother   . Cancer Maternal Grandfather     pancreatic  . Lupus Paternal Grandmother     Review of Systems  Constitutional: Negative for chills, fever and weight loss.  HENT: Negative for congestion and hearing loss.        Dental problem  Eyes: Negative for blurred vision and pain.  Respiratory: Negative for cough  and shortness of breath.   Cardiovascular: Negative for chest pain and leg swelling.  Gastrointestinal: Negative for abdominal pain, constipation, diarrhea and heartburn.  Genitourinary: Positive for frequency. Negative for dysuria.  Musculoskeletal: Negative for falls, joint pain and myalgias.  Neurological: Negative for dizziness, seizures and headaches.  Psychiatric/Behavioral: Positive for memory loss. Negative for depression. The patient is not nervous/anxious and does not have insomnia.        "forgetful"   BP 110/68 (BP Location: Right Arm, Patient Position: Sitting, Cuff Size: Normal)   Pulse 68   Temp 98.1 F (36.7 C) (Temporal)   Resp 16   Ht  (1.702 m)   Wt 169 lb (76.7 kg)   SpO2 99%   BMI 26.47 kg/m   Physical Exam  Constitutional: He is oriented to person, place, and time. He appears well-developed and well-nourished.  HENT:  Head: Normocephalic and atraumatic.  Right Ear: External ear normal.  Left Ear: External ear normal.  Mouth/Throat: Oropharynx is clear and moist.  Gum hypertrophy and friability  Eyes: Conjunctivae are normal. Pupils are equal, round, and reactive to light.  Neck: Normal range of motion. Neck supple. No thyromegaly present.  Cardiovascular: Normal rate, regular rhythm and normal heart sounds.  Pulmonary/Chest: Effort normal and breath sounds normal. No respiratory distress.  Abdominal: Soft. Bowel sounds are normal.  Musculoskeletal: Normal range of motion. He exhibits no edema.  Lymphadenopathy:    He has no cervical adenopathy.  Neurological: He is alert and oriented to person, place, and time. He displays normal reflexes.  Gait normal  Skin: Skin is warm and dry.  Psychiatric: He has a normal mood and affect. His behavior is normal. Thought content normal.  Nursing note and vitals reviewed.   1. Personal history of kidney stones  - CBC - Lipid panel - Comprehensive metabolic panel - Urinalysis, Routine w reflex  microscopic  2. Encounter to establish care with new doctor   3. Lower urinary tract symptoms (LUTS) Shared decision regarding risks and benefits of the PSA.  He desires screening. - PSA   Patient Instructions  Need blood work Drink plenty of water Eat well balance diet Exercise every day that you are able I will send you a letter with your test results.  If there is anything of concern, we will call right away.  Routine visit yearly     Eustace Moore, MD

## 2016-12-28 NOTE — Patient Instructions (Addendum)
Need blood work Drink plenty of water Eat well balance diet Exercise every day that you are able I will send you a letter with your test results.  If there is anything of concern, we will call right away.  Routine visit yearly

## 2016-12-29 LAB — COMPREHENSIVE METABOLIC PANEL
ALT: 13 U/L (ref 9–46)
AST: 21 U/L (ref 10–40)
Albumin: 4 g/dL (ref 3.6–5.1)
Alkaline Phosphatase: 71 U/L (ref 40–115)
BUN: 20 mg/dL (ref 7–25)
CO2: 25 mmol/L (ref 20–31)
Calcium: 9.2 mg/dL (ref 8.6–10.3)
Chloride: 104 mmol/L (ref 98–110)
Creat: 0.96 mg/dL (ref 0.60–1.35)
Glucose, Bld: 79 mg/dL (ref 65–99)
Potassium: 4.3 mmol/L (ref 3.5–5.3)
Sodium: 138 mmol/L (ref 135–146)
Total Bilirubin: 0.7 mg/dL (ref 0.2–1.2)
Total Protein: 6.7 g/dL (ref 6.1–8.1)

## 2016-12-29 LAB — URINALYSIS, ROUTINE W REFLEX MICROSCOPIC
Bilirubin Urine: NEGATIVE
Glucose, UA: NEGATIVE
Hgb urine dipstick: NEGATIVE
Ketones, ur: NEGATIVE
Leukocytes, UA: NEGATIVE
Nitrite: NEGATIVE
Protein, ur: NEGATIVE
Specific Gravity, Urine: 1.022 (ref 1.001–1.035)
pH: 6.5 (ref 5.0–8.0)

## 2016-12-29 LAB — LIPID PANEL
Cholesterol: 175 mg/dL (ref <200)
HDL: 77 mg/dL (ref >40)
LDL Cholesterol: 85 mg/dL (ref <100)
Total CHOL/HDL Ratio: 2.3 Ratio (ref <5.0)
Triglycerides: 67 mg/dL (ref <150)
VLDL: 13 mg/dL (ref <30)

## 2016-12-29 LAB — PSA: PSA: 0.6 ng/mL (ref <=4.0)

## 2016-12-31 ENCOUNTER — Encounter: Payer: Self-pay | Admitting: Family Medicine

## 2017-01-21 ENCOUNTER — Encounter: Payer: Self-pay | Admitting: Family Medicine

## 2017-01-21 ENCOUNTER — Ambulatory Visit (HOSPITAL_COMMUNITY)
Admission: RE | Admit: 2017-01-21 | Discharge: 2017-01-21 | Disposition: A | Discharge status: Home / Self Care | Payer: BLUE CROSS/BLUE SHIELD | Source: Ambulatory Visit | Attending: Family Medicine | Admitting: Family Medicine

## 2017-01-21 ENCOUNTER — Ambulatory Visit (INDEPENDENT_AMBULATORY_CARE_PROVIDER_SITE_OTHER): Payer: BLUE CROSS/BLUE SHIELD | Admitting: Family Medicine

## 2017-01-21 VITALS — BP 110/68 | HR 80 | Temp 97.6°F | Resp 16 | Ht 67.0 in | Wt 172.0 lb

## 2017-01-21 DIAGNOSIS — R3129 Other microscopic hematuria: Secondary | ICD-10-CM | POA: Diagnosis not present

## 2017-01-21 DIAGNOSIS — Z87442 Personal history of urinary calculi: Secondary | ICD-10-CM | POA: Diagnosis not present

## 2017-01-21 DIAGNOSIS — K439 Ventral hernia without obstruction or gangrene: Secondary | ICD-10-CM | POA: Insufficient documentation

## 2017-01-21 DIAGNOSIS — N420 Calculus of prostate: Secondary | ICD-10-CM | POA: Diagnosis not present

## 2017-01-21 DIAGNOSIS — R109 Unspecified abdominal pain: Secondary | ICD-10-CM

## 2017-01-21 DIAGNOSIS — N200 Calculus of kidney: Secondary | ICD-10-CM | POA: Diagnosis not present

## 2017-01-21 LAB — POCT URINALYSIS DIPSTICK
Bilirubin, UA: NEGATIVE
Glucose, UA: NEGATIVE
Leukocytes, UA: NEGATIVE
Nitrite, UA: NEGATIVE
Protein, UA: NEGATIVE
Spec Grav, UA: 1.025 (ref 1.010–1.025)
Urobilinogen, UA: 1 E.U./dL
pH, UA: 6 (ref 5.0–8.0)

## 2017-01-21 MED ORDER — HYDROMORPHONE HCL 4 MG PO TABS: 4.0000 mg | ORAL_TABLET | ORAL | 0 refills | Status: DC | PRN

## 2017-01-21 MED ORDER — PROMETHAZINE HCL 25 MG PO TABS: 25.0000 mg | ORAL_TABLET | Freq: Three times a day (TID) | ORAL | 0 refills | Status: DC | PRN

## 2017-01-21 NOTE — Patient Instructions (Signed)
Go to hospital for CT scan

## 2017-01-21 NOTE — Progress Notes (Signed)
Chief Complaint  Patient presents with  . Flank Pain    kidney stones   Pain for a week Pushed fluids and started flomax Passed 2 small stones Still with flank pain R,- getting worse - with nausea No hematuria or cloudy urine No fever or chills Taking rare oxycodone , it helps,  but makes him have nausea/vomiting even with food. Is out of phenergan  Patient Active Problem List   Diagnosis Date Noted  . Personal history of kidney stones 12/28/2016    Outpatient Encounter Prescriptions as of 01/21/2017  Medication Sig  . HYDROmorphone (DILAUDID) 4 MG tablet Take 1 tablet (4 mg total) by mouth every 4 (four) hours as needed for severe pain.  . promethazine (PHENERGAN) 25 MG tablet Take 1 tablet (25 mg total) by mouth every 8 (eight) hours as needed for nausea or vomiting.   No facility-administered encounter medications on file as of 01/21/2017.     Allergies  Allergen Reactions  . Vicodin [Hydrocodone-Acetaminophen] Nausea And Vomiting    Nausea - intolerance    Review of Systems  Constitutional: Negative for chills and fever.  HENT: Negative for congestion and dental problem.   Respiratory: Negative for cough and shortness of breath.   Cardiovascular: Negative for chest pain, palpitations and leg swelling.  Gastrointestinal: Negative for abdominal distention and abdominal pain.  Genitourinary: Positive for difficulty urinating, flank pain and frequency. Negative for dysuria and hematuria.  Musculoskeletal: Negative for arthralgias and back pain.  Skin: Negative for rash.    BP 110/68 (BP Location: Right Arm, Patient Position: Sitting, Cuff Size: Normal)   Pulse 80   Temp 97.6 F (36.4 C) (Temporal)   Resp 16   Ht 5\' 7"  (1.702 m)   Wt 172 lb (78 kg)   SpO2 100%   BMI 26.94 kg/m   Physical Exam  Constitutional: He is oriented to person, place, and time. He appears well-developed and well-nourished. He appears distressed.  uncomfortable.  Paces the room.    HENT:  Head: Normocephalic and atraumatic.  Mouth/Throat: Oropharynx is clear and moist.  Eyes: Conjunctivae are normal. Pupils are equal, round, and reactive to light.  Neck: Normal range of motion.  Cardiovascular: Normal rate, regular rhythm and normal heart sounds.   Pulmonary/Chest: Effort normal and breath sounds normal. No respiratory distress.  Abdominal: Soft. Bowel sounds are normal.  Acute right CVAT to tapping  Lymphadenopathy:    He has no cervical adenopathy.  Neurological: He is alert and oriented to person, place, and time.  Psychiatric: He has a normal mood and affect. His behavior is normal.   Results for orders placed or performed in visit on 01/21/17  POCT urinalysis dipstick  Result Value Ref Range   Color, UA yellow    Clarity, UA clear    Glucose, UA neg    Bilirubin, UA neg    Ketones, UA trace    Spec Grav, UA 1.025 1.010 - 1.025   Blood, UA trace    pH, UA 6.0 5.0 - 8.0   Protein, UA neg    Urobilinogen, UA 1.0 0.2 or 1.0 E.U./dL   Nitrite, UA neg    Leukocytes, UA Negative Negative    ASSESSMENT/PLAN:  1. Acute right flank pain  - POCT urinalysis dipstick - CT RENAL STONE STUDY;  IMPRESSION: Nonobstructing calculi in each kidney. No hydronephrosis or ureteral calculus on either side. There are prostatic calculi present.  No bowel obstruction.  No abscess.  Appendix appears normal.  Small ventral hernia containing only fat.   Electronically Signed   By: Bretta Bang III M.D.   On: 01/21/2017 14:54  Patient Instructions  Go to hospital for CT scan discussed fluids, stone prevention, pain control, medicine, Urology follow up  Eustace Moore, MD

## 2017-01-22 ENCOUNTER — Encounter: Payer: Self-pay | Admitting: Family Medicine

## 2017-01-22 ENCOUNTER — Telehealth: Payer: Self-pay | Admitting: Family Medicine

## 2017-01-22 NOTE — Telephone Encounter (Signed)
Call and got approval number  [id number]

## 2017-01-22 NOTE — Telephone Encounter (Signed)
Yes.  Please make sure they have called his urologist to update on his condition.

## 2017-01-22 NOTE — Telephone Encounter (Signed)
Patient's wife Melvin Nichols calling to request and additional date for out of work for her husband in the form of a note. He would prefer to return to work Friday...and if he is able to return before, he will do so.  He still has the following symptoms:  Nausea, vomiting, and pain.  He is still unable to sit comfortably, prefers to lay down.  Wife can pick up when ready.  [phone]

## 2017-01-22 NOTE — Telephone Encounter (Signed)
[facility] states that this order does not meet medical necessity , they are requesting a call from a physician [phone]

## 2017-01-24 NOTE — Telephone Encounter (Signed)
[employer], states he is still having a little pain, but is feeling better and will call his urologist today

## 2017-02-13 ENCOUNTER — Telehealth: Payer: Self-pay | Admitting: Family Medicine

## 2017-02-13 NOTE — Telephone Encounter (Signed)
Patient called in requesting a referral to a urologist. He is not passing his kidney stones, he is at work toughing through the pain / throwing up during this flare up.  If you can put this in as urgent I can hopefully have the appt by Friday.

## 2017-02-14 NOTE — Telephone Encounter (Signed)
Dusty, this gentleman HAS a urologist and just had a surgery for stone removal in March this year.  Cant he go back to them??  He was instructed to call them weeks ago.

## 2017-03-14 ENCOUNTER — Encounter: Payer: Self-pay | Admitting: Family Medicine

## 2017-03-14 ENCOUNTER — Ambulatory Visit (INDEPENDENT_AMBULATORY_CARE_PROVIDER_SITE_OTHER): Payer: BLUE CROSS/BLUE SHIELD | Admitting: Family Medicine

## 2017-03-14 VITALS — BP 116/68 | HR 84 | Temp 97.4°F | Resp 16 | Ht 67.0 in | Wt 168.1 lb

## 2017-03-14 DIAGNOSIS — R5383 Other fatigue: Secondary | ICD-10-CM | POA: Diagnosis not present

## 2017-03-14 DIAGNOSIS — N529 Male erectile dysfunction, unspecified: Secondary | ICD-10-CM

## 2017-03-14 DIAGNOSIS — R6882 Decreased libido: Secondary | ICD-10-CM

## 2017-03-14 MED ORDER — TAMSULOSIN HCL 0.4 MG PO CAPS: 0.4000 mg | ORAL_CAPSULE | Freq: Every day | ORAL | 3 refills | Status: AC

## 2017-03-14 NOTE — Progress Notes (Signed)
Chief Complaint  Patient presents with  . Follow-up    ED   Patient is here for routine follow-up. He has been having difficulty with kidney stones. Finally the kidney stone passed on its own. He has not yet seen his urologist. He is currently not taking any prescription medications. Today he tells me he's had difficulty for approximately a year with energy, libido, sexual desire, marital relationships, ED. He states that he has had side effects from Cialis and when necessary Viagra. He [facility] [place] and had a blood test that identified low testosterone. They offered him testosterone replacement therapy and ED therapy including penile injections, but the cost was prohibitive and not covered under insurance. He therefore wants to see if his insurance will pay for evaluation and treatment of his E D. He does not have any medicine that cause ED. He doesn't have any pre-existing medical conditions that would cause vascular insufficiency. He doesn't feel like he has stress, anxiety, depression, or emotional barriers to having a normal relationship. He states that his wife and he have had discussions about his reduced desire. They have had sexual relations only once in the last year. He states this bothers her more than it does him. He states he has noticed increased fatigue, decreased energy in general. Procrastination. We discussed that if he has hypogonadism with a measurable low testosterone, he will be referred to endocrinology for testosterone replacement. This by itself may solve his problem. If not, he is referred back to his urologist to discuss ED treatment. I'm not comfortable prescribing injectables. We also discussed that if his testosterone is normal and his wife may want to go to marital counseling.   Patient Active Problem List   Diagnosis Date Noted  . Personal history of kidney stones 12/28/2016    Outpatient Encounter Prescriptions as of  03/14/2017  Medication Sig  . tamsulosin (FLOMAX) 0.4 MG CAPS capsule Take 1 capsule (0.4 mg total) by mouth daily after breakfast.   No facility-administered encounter medications on file as of 03/14/2017.     Allergies  Allergen Reactions  . Vicodin [Hydrocodone-Acetaminophen] Nausea And Vomiting    Nausea - intolerance    Review of Systems  Constitutional: Positive for fatigue. Negative for activity change and appetite change.  HENT: Negative for congestion and dental problem.   Eyes: Negative for photophobia and visual disturbance.  Respiratory: Negative for cough and shortness of breath.   Cardiovascular: Negative for chest pain and leg swelling.  Gastrointestinal: Negative for constipation and diarrhea.  Genitourinary: Negative for difficulty urinating and dysuria.       ED, low libido  Musculoskeletal: Negative for arthralgias and back pain.  Neurological: Negative for dizziness and headaches.  Psychiatric/Behavioral: Negative for dysphoric mood and sleep disturbance. The patient is not nervous/anxious.        Denies stress, anxiety, depression, irritability   BP 116/68 (BP Location: Right Arm, Patient Position: Sitting, Cuff Size: Normal)   Pulse 84   Temp (!) 97.4 F (36.3 C) (Temporal)   Resp 16   Ht 5\' 7"  (1.702 m)   Wt 168 lb 1.9 oz (76.3 kg)   SpO2 98%   BMI 26.33 kg/m   Physical Exam  Constitutional: He is oriented to person, place, and time. He appears well-developed and well-nourished. No distress.  HENT:  Head: Normocephalic and atraumatic.  Mouth/Throat: Oropharynx is clear and moist.  Cardiovascular: Normal rate and regular rhythm.   Pulmonary/Chest:  Effort normal and breath sounds normal.  Neurological: He is alert and oriented to person, place, and time.  Psychiatric: He has a normal mood and affect. His behavior is normal.    ASSESSMENT/PLAN:  1. Erectile dysfunction, unspecified erectile dysfunction type  - Testosterone Total,Free,Bio,  Males  2. Fatigue, unspecified type  - Testosterone Total,Free,Bio, Males  3. Decreased libido  - Testosterone Total,Free,Bio, Males Greater than 50% of this visit was spent in counseling and coordinating care.  Total face to face time:   25 minutes discussing problems indicated above   Patient Instructions  I will get your test results , and give you a call If testosterone is needed, I will refer to endocrinology  We will call about your urology follow up  See me as needed   Eustace Moore, MD

## 2017-03-14 NOTE — Patient Instructions (Signed)
I will get your test results , and give you a call If testosterone is needed, I will refer to endocrinology  We will call about your urology follow up  See me as needed

## 2017-03-15 LAB — TESTOSTERONE TOTAL,FREE,BIO, MALES
Albumin: 4.2 g/dL (ref 3.6–5.1)
Sex Hormone Binding: 44 nmol/L (ref 10–50)
Testosterone, Bioavailable: 77.9 ng/dL — ABNORMAL LOW (ref 110.0–575.0)
Testosterone, Free: 40.4 pg/mL — ABNORMAL LOW (ref 46.0–224.0)
Testosterone: 390 ng/dL (ref 250–827)

## 2017-03-19 ENCOUNTER — Encounter: Payer: Self-pay | Admitting: Family Medicine

## 2017-03-19 DIAGNOSIS — E291 Testicular hypofunction: Secondary | ICD-10-CM

## 2017-03-21 ENCOUNTER — Telehealth: Payer: Self-pay | Admitting: Family Medicine

## 2017-03-21 NOTE — Telephone Encounter (Signed)
error 

## 2017-03-22 ENCOUNTER — Encounter: Payer: Self-pay | Admitting: "Endocrinology

## 2017-03-22 ENCOUNTER — Ambulatory Visit (INDEPENDENT_AMBULATORY_CARE_PROVIDER_SITE_OTHER): Payer: BLUE CROSS/BLUE SHIELD | Admitting: "Endocrinology

## 2017-03-22 VITALS — BP 106/72 | HR 71 | Ht 67.0 in | Wt 167.0 lb

## 2017-03-22 DIAGNOSIS — N529 Male erectile dysfunction, unspecified: Secondary | ICD-10-CM

## 2017-03-22 DIAGNOSIS — E291 Testicular hypofunction: Secondary | ICD-10-CM | POA: Insufficient documentation

## 2017-03-22 MED ORDER — TADALAFIL 5 MG PO TABS: 5.0000 mg | ORAL_TABLET | Freq: Every day | ORAL | 6 refills | Status: AC | PRN

## 2017-03-22 NOTE — Progress Notes (Signed)
HPI: Melvin Nichols is a 46 y.o.-year-old man, referred by his PCP, Dr. Delton See, for evaluation and management of low testosterone.  He admits for decreased libido. Has difficulty obtaining or maintaining an erection. No trauma to testes, Chemotherapy,  testicular irradiation nor surgery No h/o of mumps orchitis/h/o autoimmune disorders. No h/o cryptorchidism. He grew and went through puberty like his peers. No shrinking of testes.  No incomplete/delayed sexual development.     No breast discomfort/gynecomastia.    No loss of body hair (axillary/pubic)/decreased need for shaving. No abnormal sense of smell (only allergies). No hot flushes. No vision problems. No report of changing headaches. No FH of hypogonadism/infertility . No personal h/o infertility - has 5 children who he fathers. No FH of hemochromatosis or pituitary tumors. No excessive weight gain or loss.  No chronic diseases. He did have exposure to opioids related to pain from nephrolithiasis. Not on opiates currently, does not take steroids.  No more than 2 drinks a day of alcohol at a time, and this is rarely. No anabolic steroids use. No herbal medicines. Not on antidepressants.  He does not have family  Or personal history of premature  cardiac disease.   ROS: Constitutional: no weight gain/loss, +fatigue, no subjective hyperthermia/hypothermia Eyes: no blurry vision, no xerophthalmia ENT: no sore throat, no nodules palpated in throat, no dysphagia/odynophagia, no hoarseness Cardiovascular: no CP/SOB/palpitations/leg swelling Respiratory: no cough/SOB Gastrointestinal: no N/V/D/C Musculoskeletal: no muscle/joint aches Skin: no rashes Neurological: no tremors/numbness/tingling/dizziness Psychiatric: no depression/anxiety  PE: BP 106/72   Pulse 71   Ht 5\' 7"  (1.702 m)   Wt 167 lb (75.8 kg)   BMI 26.16 kg/m  Wt Readings from Last 3 Encounters:  03/22/17 167 lb (75.8 kg)  03/14/17 168 lb 1.9 oz (76.3  kg)  01/21/17 172 lb (78 kg)   Constitutional: Appropriate weight for height, in NAD Eyes: PERRLA, EOMI, no exophthalmos ENT: moist mucous membranes, no thyromegaly, no cervical lymphadenopathy Cardiovascular: RRR, No MRG Respiratory: CTA B Gastrointestinal: abdomen soft, NT, ND, BS+ Musculoskeletal: no deformities, strength intact in all 4 Skin: moist, warm, no rashes Neurological: no tremor with outstretched hands, DTR normal in all 4 Genital exam: normal male escutcheon, no inguinal LAD, normal phallus, testes ~20 mL, no testicular masses, no penile discharge.  No gynecomastia.    Recent Results (from the past 2160 hour(s))  CBC     Status: None   Collection Time: 12/28/16  1:58 PM  Result Value Ref Range   WBC 9.6 3.8 - 10.8 K/uL   RBC 4.75 4.20 - 5.80 MIL/uL   Hemoglobin 13.6 13.2 - 17.1 g/dL   HCT 95.6 21.3 - 08.6 %   MCV 86.9 80.0 - 100.0 fL   MCH 28.6 27.0 - 33.0 pg   MCHC 32.9 32.0 - 36.0 g/dL   RDW 57.8 46.9 - 62.9 %   Platelets 234 140 - 400 K/uL   MPV 9.5 7.5 - 12.5 fL  Lipid panel     Status: None   Collection Time: 12/28/16  1:58 PM  Result Value Ref Range   Cholesterol 175 <200 mg/dL   Triglycerides 67 <528 mg/dL   HDL 77 >41 mg/dL   Total CHOL/HDL Ratio 2.3 <5.0 Ratio   VLDL 13 <30 mg/dL   LDL Cholesterol 85 <324 mg/dL  Comprehensive metabolic panel     Status: None   Collection Time: 12/28/16  1:58 PM  Result Value Ref Range   Sodium 138 135 - 146 mmol/L   Potassium  4.3 3.5 - 5.3 mmol/L   Chloride 104 98 - 110 mmol/L   CO2 25 20 - 31 mmol/L   Glucose, Bld 79 65 - 99 mg/dL   BUN 20 7 - 25 mg/dL   Creat 0.980.96 1.190.60 - 1.471.35 mg/dL   Total Bilirubin 0.7 0.2 - 1.2 mg/dL   Alkaline Phosphatase 71 40 - 115 U/L   AST 21 10 - 40 U/L   ALT 13 9 - 46 U/L   Total Protein 6.7 6.1 - 8.1 g/dL   Albumin 4.0 3.6 - 5.1 g/dL   Calcium 9.2 8.6 - 82.910.3 mg/dL  Urinalysis, Routine w reflex microscopic     Status: None   Collection Time: 12/28/16  1:58 PM  Result Value  Ref Range   Color, Urine YELLOW YELLOW   APPearance CLEAR CLEAR   Specific Gravity, Urine 1.022 1.001 - 1.035   pH 6.5 5.0 - 8.0   Glucose, UA NEGATIVE NEGATIVE   Bilirubin Urine NEGATIVE NEGATIVE   Ketones, ur NEGATIVE NEGATIVE   Hgb urine dipstick NEGATIVE NEGATIVE   Protein, ur NEGATIVE NEGATIVE   Nitrite NEGATIVE NEGATIVE   Leukocytes, UA NEGATIVE NEGATIVE  PSA     Status: None   Collection Time: 12/28/16  1:58 PM  Result Value Ref Range   PSA 0.6 <=4.0 ng/mL    Comment:   The total PSA value from this assay system is standardized against the WHO standard. The test result will be approximately 20% lower when compared to the equimolar-standardized total PSA ([employer]). Comparison of serial PSA results should be interpreted with this fact in mind.   This test was performed using the Siemens chemiluminescent method. Values obtained from different assay methods cannot be used interchangeably. PSA levels, regardless of value, should not be interpreted as absolute evidence of the presence or absence of disease.     POCT urinalysis dipstick     Status: Abnormal   Collection Time: 01/21/17  1:31 PM  Result Value Ref Range   Color, UA yellow    Clarity, UA clear    Glucose, UA neg    Bilirubin, UA neg    Ketones, UA trace    Spec Grav, UA 1.025 1.010 - 1.025   Blood, UA trace    pH, UA 6.0 5.0 - 8.0   Protein, UA neg    Urobilinogen, UA 1.0 0.2 or 1.0 E.U./dL   Nitrite, UA neg    Leukocytes, UA Negative Negative  Testosterone Total,Free,Bio, Males     Status: Abnormal   Collection Time: 03/14/17 12:03 PM  Result Value Ref Range   Testosterone 390 250 - 827 ng/dL   Albumin 4.2 3.6 - 5.1 g/dL   Sex Hormone Binding 44 10 - 50 nmol/L   Testosterone, Free 40.4 (L) 46.0 - 224.0 pg/mL   Testosterone, Bioavailable 77.9 (L) 110.0 - 575.0 ng/dL    ASSESSMENT: 1. 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 Erectile Dysfunction  PLAN:   I had a long  discussion with the pt regarding his testosterone results, which we reviewed together. I explained that the total testosterone consists of bioavailable T (free T + weakly bound) and bound testosterone.  - His total testosterone is normal, in fact draw not 12:03 PM,  Later in the afternoon - when the testes do not secrete enough testosterone . - He is free testosterone and bioavailable testosterone is low normal. The treatment for this is not exogenous testosterone, but improving his sleep, diet  and exercise. Therefore, in his case, with a total testosterone in the normal range ( 390) , testosterone treatment is not indicated, and might even be harmful as per the latest studies that showed increased cardiac risk with testosterone supplementation. - I advised him that testosterone is a controlled substance and, per guidelines, is only recommended for pts with clearly low testosterone. There is also no evidence that increasing the testosterone within the normal range will help with either erections or his energy.   -  I will obtain LH/FSH  and prolactin along with total, free, and bioavailable testosterone before his next visit in 6 months. - If he is testosterone drops to be low 250 on 2 separate occasions, he would be considered for testosterone supplement.  - If there is any indication of secondary hypogonadism, he may need MRI imaging of saline/pituitary for now.  2. Erectile dysfunction - This is most likely not caused by the decreased testosterone. Also, erection is a function of vascular health of the corpora cavernosa, discussed with patient. - he can benefit from a PDE5 inhibitor- I discussed and initiated prescription for low-dose Cialis to use when necessary.   Marquis Lunch, MD Phone: [phone]  Fax: [phone]   03/22/2017, 12:48 PM

## 2017-03-26 ENCOUNTER — Other Ambulatory Visit: Payer: Self-pay | Admitting: "Endocrinology

## 2017-04-19 ENCOUNTER — Other Ambulatory Visit: Payer: Self-pay | Admitting: Family Medicine

## 2017-06-15 ENCOUNTER — Emergency Department (HOSPITAL_COMMUNITY)
Admission: EM | Admit: 2017-06-15 | Discharge: 2017-06-15 | Disposition: A | Discharge status: Home / Self Care | Payer: BLUE CROSS/BLUE SHIELD | Attending: Emergency Medicine | Admitting: Emergency Medicine

## 2017-06-15 ENCOUNTER — Encounter (HOSPITAL_COMMUNITY): Payer: Self-pay

## 2017-06-15 DIAGNOSIS — N529 Male erectile dysfunction, unspecified: Secondary | ICD-10-CM | POA: Diagnosis not present

## 2017-06-15 DIAGNOSIS — Z5321 Procedure and treatment not carried out due to patient leaving prior to being seen by health care provider: Secondary | ICD-10-CM | POA: Diagnosis not present

## 2017-06-15 NOTE — ED Notes (Signed)
Pt called for room, no answer.

## 2017-06-15 NOTE — ED Triage Notes (Signed)
Pt states he had priapism early this year where he need a procedure to drain blood from his penis to take the erection down; pt states he started having the same sx at 5 pm today; pt states he is anxious because he is trying to prevent having the same procedure; pt is request a shot that will help otherwise a urologist will need to be be called in; pt states pain at 10/10 on arrival.

## 2017-09-23 ENCOUNTER — Ambulatory Visit: Payer: BLUE CROSS/BLUE SHIELD | Admitting: "Endocrinology

## 2017-11-18 ENCOUNTER — Encounter: Payer: Self-pay | Admitting: Family Medicine

## 2024-01-11 ENCOUNTER — Ambulatory Visit (HOSPITAL_COMMUNITY)
Admission: RE | Admit: 2024-01-11 | Discharge: 2024-01-11 | Disposition: A | Discharge status: Home / Self Care | Payer: Self-pay | Source: Ambulatory Visit | Attending: Physician Assistant | Admitting: Physician Assistant

## 2024-01-11 ENCOUNTER — Encounter (HOSPITAL_COMMUNITY): Payer: Self-pay

## 2024-01-11 ENCOUNTER — Other Ambulatory Visit: Payer: Self-pay

## 2024-01-11 VITALS — BP 141/86 | HR 76 | Temp 98.8°F | Resp 18

## 2024-01-11 DIAGNOSIS — L0201 Cutaneous abscess of face: Secondary | ICD-10-CM

## 2024-01-11 MED ORDER — AMOXICILLIN-POT CLAVULANATE 875-125 MG PO TABS: 1.0000 | ORAL_TABLET | Freq: Two times a day (BID) | ORAL | 0 refills | Status: AC

## 2024-01-11 NOTE — Discharge Instructions (Signed)
 Great to meet you today! Take Tylenol  or Ibuprofen to help with pain. Warm compresses to area Wash with gentle soap and water Take Augmentin as directed This will continue to drain the next several days  Return to care if larger, fever, more pain, etc.

## 2024-01-11 NOTE — ED Provider Notes (Signed)
 [facility]   MRN: [id number] DOB: 1971/07/24  Subjective:   Melvin Nichols is a 53 y.o. male presenting for left facial pain and swelling.  Patient reports that initially 2 weeks ago he was using a different product to try to die his beard.  He thinks he left the product on too long and he started to have some skin irritation.  In the last few days, the left side of his lower jaw has started to become very large and swollen.  Very painful to the touch.  He has been taking Benadryl  and ibuprofen with minimal relief.  He denies any fevers or chills.  He is able to eat and drink like normal.  He has no swallowing problems.  He has no pain across the sinuses or along his neck.  No pain into his ear.  No headache or dizziness.  No current facility-administered medications for this encounter.  Current Outpatient Medications:    amoxicillin-clavulanate (AUGMENTIN) 875-125 MG tablet, Take 1 tablet by mouth every 12 (twelve) hours. Take with food., Disp: 20 tablet, Rfl: 0   tadalafil  (CIALIS ) 5 MG tablet, Take 1 tablet (5 mg total) by mouth daily as needed for erectile dysfunction., Disp: 6 tablet, Rfl: 6   tamsulosin  (FLOMAX ) 0.4 MG CAPS capsule, Take 1 capsule (0.4 mg total) by mouth daily after breakfast., Disp: 90 capsule, Rfl: 3   Allergies  Allergen Reactions   Vicodin [Hydrocodone-Acetaminophen ] Nausea And Vomiting    Nausea - intolerance    Past Medical History:  Diagnosis Date   Renal disorder    stones     Past Surgical History:  Procedure Laterality Date   LITHOTRIPSY     VASECTOMY      Family History  Problem Relation Age of Onset   Cancer Mother 25       breast, metastatic   Diabetes Maternal Grandmother    Cancer Maternal Grandfather        pancreatic   Lupus Paternal Grandmother     Social History   Tobacco Use   Smoking status: Never   Smokeless tobacco: Never  Vaping Use   Vaping status: Never Used  Substance Use Topics   Alcohol use:  No   Drug use: No    ROS REFER TO HPI FOR PERTINENT POSITIVES AND NEGATIVES   Objective:   Vitals: BP (!) 141/86 (BP Location: Left Arm)   Pulse 76   Temp 98.8 F (37.1 C) (Oral)   Resp 18   SpO2 96%   Physical Exam Vitals and nursing note reviewed.  Constitutional:      Appearance: Normal appearance.  HENT:     Head:      Right Ear: Tympanic membrane normal.     Left Ear: Tympanic membrane normal.     Nose:     Comments: No sinus pain    Mouth/Throat:     Mouth: Mucous membranes are moist.     Pharynx: Oropharynx is clear.  Eyes:     Extraocular Movements: Extraocular movements intact.     Conjunctiva/sclera: Conjunctivae normal.     Pupils: Pupils are equal, round, and reactive to light.  Musculoskeletal:        General: Normal range of motion.     Comments: Moves neck and jaw freely  Neurological:     General: No focal deficit present.     Mental Status: He is alert and oriented to person, place, and time.  Psychiatric:  Mood and Affect: Mood normal.     No results found for this or any previous visit (from the past 24 hours).  Assessment and Plan :   PDMP not reviewed this encounter.  1. Cutaneous abscess of face    Simple cutaneous abscess of left lower jaw line. No red flag symptoms or features. Discussed with patient we can give antibiotics, warm compresses, wait a few days, or attempt I & D process here. Shared decision making, he elected to have I & D done in the urgent care. See procedure note. Very successful. Augmentin given. Instructed to take ibuprofen and tylenol  at home prn. RTC precautions discussed.  PROCEDURE NOTE: I&D of Abscess Verbal consent obtained. Local anesthesia with 2 cc of 2% lidocaine. Site cleansed with iodine. Incision was made using an 11 blade, copious amount expressed consisting of a mixture of pus and serosanguinous fluid. Cleansed and dressed. Tolerated well. Aftercare instructions provided.     AllwardtDeleta Felix, PA-C 01/11/24 1328

## 2024-01-11 NOTE — ED Triage Notes (Addendum)
 Patient has been using products to dye beard.  The last product was used 2 weeks ago.  Patient left product on too long  Patient has been taking benadryl  and ibuprofen  for symptoms.    Left jaw is very large, swollen and irritated and painful and getting larger.
